# Patient Record
Sex: Female | Born: 1945 | Race: White | Hispanic: No | Smoking: Never smoker
Health system: Southern US, Community
[De-identification: ages and names within clinical notes are randomized; demographics above are authoritative.]

## PROBLEM LIST (undated history)

## (undated) DIAGNOSIS — R519 Headache, unspecified: Secondary | ICD-10-CM

## (undated) DIAGNOSIS — F419 Anxiety disorder, unspecified: Secondary | ICD-10-CM

## (undated) DIAGNOSIS — M25561 Pain in right knee: Secondary | ICD-10-CM

## (undated) DIAGNOSIS — R200 Anesthesia of skin: Secondary | ICD-10-CM

## (undated) DIAGNOSIS — M4310 Spondylolisthesis, site unspecified: Secondary | ICD-10-CM

## (undated) DIAGNOSIS — F32A Depression, unspecified: Secondary | ICD-10-CM

## (undated) DIAGNOSIS — I639 Cerebral infarction, unspecified: Secondary | ICD-10-CM

## (undated) DIAGNOSIS — D649 Anemia, unspecified: Secondary | ICD-10-CM

## (undated) DIAGNOSIS — M5136 Other intervertebral disc degeneration, lumbar region: Secondary | ICD-10-CM

## (undated) DIAGNOSIS — M2550 Pain in unspecified joint: Secondary | ICD-10-CM

## (undated) DIAGNOSIS — I7090 Unspecified atherosclerosis: Secondary | ICD-10-CM

## (undated) DIAGNOSIS — R202 Paresthesia of skin: Secondary | ICD-10-CM

## (undated) DIAGNOSIS — I10 Essential (primary) hypertension: Secondary | ICD-10-CM

## (undated) DIAGNOSIS — M545 Low back pain, unspecified: Secondary | ICD-10-CM

## (undated) DIAGNOSIS — M51369 Other intervertebral disc degeneration, lumbar region without mention of lumbar back pain or lower extremity pain: Secondary | ICD-10-CM

## (undated) DIAGNOSIS — M7989 Other specified soft tissue disorders: Secondary | ICD-10-CM

## (undated) DIAGNOSIS — F319 Bipolar disorder, unspecified: Secondary | ICD-10-CM

## (undated) DIAGNOSIS — E119 Type 2 diabetes mellitus without complications: Secondary | ICD-10-CM

## (undated) DIAGNOSIS — A64 Unspecified sexually transmitted disease: Secondary | ICD-10-CM

## (undated) DIAGNOSIS — M1990 Unspecified osteoarthritis, unspecified site: Secondary | ICD-10-CM

## (undated) DIAGNOSIS — E785 Hyperlipidemia, unspecified: Secondary | ICD-10-CM

## (undated) DIAGNOSIS — G8929 Other chronic pain: Secondary | ICD-10-CM

## (undated) HISTORY — DX: Bipolar disorder, unspecified: F31.9

## (undated) HISTORY — DX: Anemia, unspecified: D64.9

## (undated) HISTORY — DX: Headache, unspecified: R51.9

## (undated) HISTORY — DX: Unspecified atherosclerosis: I70.90

## (undated) HISTORY — DX: Other specified soft tissue disorders: M79.89

## (undated) HISTORY — DX: Depression, unspecified: F32.A

## (undated) HISTORY — DX: Low back pain, unspecified: M54.50

## (undated) HISTORY — DX: Hyperlipidemia, unspecified: E78.5

## (undated) HISTORY — DX: Pain in unspecified joint: M25.50

## (undated) HISTORY — DX: Spondylolisthesis, site unspecified: M43.10

## (undated) HISTORY — DX: Other chronic pain: G89.29

## (undated) HISTORY — DX: Anesthesia of skin: R20.0

## (undated) HISTORY — DX: Unspecified osteoarthritis, unspecified site: M19.90

## (undated) HISTORY — DX: Unspecified sexually transmitted disease: A64

## (undated) HISTORY — DX: Other intervertebral disc degeneration, lumbar region without mention of lumbar back pain or lower extremity pain: M51.369

## (undated) HISTORY — DX: Paresthesia of skin: R20.2

## (undated) HISTORY — DX: Pain in right knee: M25.561

## (undated) HISTORY — DX: Type 2 diabetes mellitus without complications: E11.9

## (undated) HISTORY — DX: Other intervertebral disc degeneration, lumbar region: M51.36

## (undated) HISTORY — DX: Cerebral infarction, unspecified: I63.9

## (undated) HISTORY — DX: Essential (primary) hypertension: I10

---

## 1976-05-15 HISTORY — PX: TUBAL LIGATION: SHX77

## 2007-05-16 HISTORY — PX: CHOLECYSTECTOMY: SHX55

## 2009-05-15 HISTORY — PX: CARPAL TUNNEL RELEASE: SHX101

## 2011-05-16 HISTORY — PX: HAND SURGERY: SHX662

## 2013-05-15 HISTORY — PX: CRANIOTOMY: SHX93

## 2014-05-15 HISTORY — PX: COLONOSCOPY: SHX174

## 2015-05-16 HISTORY — PX: ABDOMINAL HYSTERECTOMY: SHX81

## 2016-05-15 HISTORY — PX: CATARACT EXTRACTION: SUR2

## 2017-07-24 DIAGNOSIS — M179 Osteoarthritis of knee, unspecified: Secondary | ICD-10-CM

## 2017-07-24 DIAGNOSIS — M1710 Unilateral primary osteoarthritis, unspecified knee: Secondary | ICD-10-CM

## 2017-07-24 HISTORY — DX: Osteoarthritis of knee, unspecified: M17.9

## 2017-07-24 HISTORY — DX: Unilateral primary osteoarthritis, unspecified knee: M17.10

## 2020-09-27 ENCOUNTER — Ambulatory Visit (INDEPENDENT_AMBULATORY_CARE_PROVIDER_SITE_OTHER): Payer: Medicare Other | Admitting: Vascular Surgery

## 2020-09-27 ENCOUNTER — Other Ambulatory Visit: Payer: Self-pay

## 2020-09-27 ENCOUNTER — Encounter: Payer: Self-pay | Admitting: Vascular Surgery

## 2020-09-27 VITALS — BP 155/66 | HR 87 | Temp 97.9°F | Resp 16 | Ht 66.0 in | Wt 185.0 lb

## 2020-09-27 DIAGNOSIS — M5136 Other intervertebral disc degeneration, lumbar region: Secondary | ICD-10-CM | POA: Diagnosis not present

## 2020-09-27 NOTE — Progress Notes (Signed)
[facility] [place]  Patient name: Sherry Moore MRN: [id number] DOB: 1946/03/03 Sex: female  REASON FOR CONSULT: Discuss exposure for OLIF with Dr. Shon Baton with Dr. Shon Baton  HPI: Sherry Moore is a 75 y.o. female, who is here today for discussion of exposure for spinal fusion.  She reports that she has had difficulty with back pain for nearly 10 years.  This is been increasingly severe over the past several years.  She has had 1 session of spine injections with no relief.  She has seen Dr. Shon Baton who is recommended fusion at the 4 5 level oblique lateral approach.  She has multiple medical issues as listed below.    Past Medical History:  Diagnosis Date  . Acquired spondylolisthesis   . Anemia   . Atherosclerosis   . Bipolar depression (HCC)   . Chronic back pain   . Degeneration of lumbar intervertebral disc   . Depression   . Diabetes mellitus without complication (HCC)    Type 1  . Hand swelling   . Headache   . Hyperlipidemia   . Hypertension   . Joint pain   . Lumbar pain   . Numbness and tingling   . Osteoarthritis   . Osteoarthritis of knee 07/24/2017  . Right knee pain   . Spondylolisthesis, grade 1   . STD (female)    History of   . Stroke (HCC)   . Swelling of both lower extremities     Family History  Problem Relation Age of Onset  . Arthritis Mother   . Stroke Mother   . Diabetes Mother   . Hypertension Mother   . Arthritis Father   . Diabetes Father   . Heart disease Sister   . Arthritis Sister   . Diabetes Sister   . Hypertension Sister   . Hypertension Brother   . Heart disease Brother   . Diabetes Brother     SOCIAL HISTORY: Social History   Socioeconomic History  . Marital status: Divorced    Spouse name: Not on file  . Number of children: Not on file  . Years of education: Not on file  . Highest education level: Not on file  Occupational History  . Occupation: RETIRED NURSE   Tobacco Use  . Smoking status: Never Smoker  . Smokeless tobacco: Never Used  Substance and Sexual Activity  . Alcohol use: Not Currently  . Drug use: Yes    Types: Marijuana  . Sexual activity: Not on file  Other Topics Concern  . Not on file  Social History Narrative  . Not on file   Social Determinants of Health   Financial Resource Strain: Not on file  Food Insecurity: Not on file  Transportation Needs: Not on file  Physical Activity: Not on file  Stress: Not on file  Social Connections: Not on file  Intimate Partner Violence: Not on file    No Known Allergies  Current Outpatient Medications  Medication Sig Dispense Refill  . acyclovir (ZOVIRAX) 400 MG tablet Take 400 mg by mouth 5 (five) times daily.    Marland Kitchen aspirin 325 MG EC tablet Take by mouth.    Marland Kitchen atorvastatin (LIPITOR) 20 MG tablet Take by mouth.    . calcium-vitamin D (OSCAL WITH D) 500-200 MG-UNIT tablet Take 1 tablet by mouth.    . cholecalciferol (VITAMIN D3) 25 MCG (1000 UNIT) tablet Take 1,000 Units by mouth daily.    . clonazePAM (KLONOPIN) 1 MG tablet Take  by mouth.    . ENALAPRIL MALEATE PO Take 20 mg by mouth in the morning and at bedtime.    . gabapentin (NEURONTIN) 300 MG capsule Take 300 mg by mouth at bedtime.    . hydrochlorothiazide (HYDRODIURIL) 25 MG tablet Take by mouth.    . insulin glargine (LANTUS) 100 UNIT/ML Solostar Pen Inject 16 Units into the skin daily.    Marland Kitchen lamoTRIgine (LAMICTAL) 200 MG tablet Take 200 mg by mouth daily.    . Magnesium Citrate 200 MG TABS Take by mouth.    . metFORMIN (GLUCOPHAGE) 1000 MG tablet Take 1,000 mg by mouth 2 (two) times daily with a meal.    . topiramate (TOPAMAX) 25 MG tablet Take by mouth.     No current facility-administered medications for this visit.    REVIEW OF SYSTEMS:  [X]  denotes positive finding, [ ]  denotes negative finding Cardiac  Comments:  Chest pain or chest pressure:    Shortness of breath upon exertion:    Short of breath when  lying flat:    Irregular heart rhythm:        Vascular    Pain in calf, thigh, or hip brought on by ambulation:    Pain in feet at night that wakes you up from your sleep:     Blood clot in your veins:    Leg swelling:         Pulmonary    Oxygen at home:    Productive cough:     Wheezing:         Neurologic    Sudden weakness in arms or legs:     Sudden numbness in arms or legs:     Sudden onset of difficulty speaking or slurred speech:    Temporary loss of vision in one eye:     Problems with dizziness:         Gastrointestinal    Blood in stool:     Vomited blood:         Genitourinary    Burning when urinating:     Blood in urine:        Psychiatric    Major depression:         Hematologic    Bleeding problems:    Problems with blood clotting too easily:        Skin    Rashes or ulcers:        Constitutional    Fever or chills:      PHYSICAL EXAM: Vitals:   09/27/20 0951  BP: (!) 155/66  Pulse: 87  Resp: 16  Temp: 97.9 F (36.6 C)  TempSrc: Other (Comment)  SpO2: 98%  Weight: 185 lb (83.9 kg)  Height: 5\' 6"  (1.676 m)    GENERAL: The patient is a well-nourished female, in no acute distress. The vital signs are documented above. CARDIOVASCULAR: 2+ radial and 2+ dorsalis pedis pulses bilaterally. PULMONARY: There is good air exchange  MUSCULOSKELETAL: There are no major deformities or cyanosis. NEUROLOGIC: No focal weakness or paresthesias are detected. SKIN: There are no ulcers or rashes noted. PSYCHIATRIC: The patient has a normal affect.  DATA:  MRI and plain films of the spine were reviewed.  She has a normal location of her aortic and vena caval bifurcation.  She does not have a CT scan of her spine.  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  Explained that Dr. Shon Baton has determined her best option for control of  her pain is surgery and one component is from the oblique approach.  I explained our role in providing exposure.  I do feel she is an acceptable risk for surgery.  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  She is not obese.  BMI is 2.9  She has had prior laparoscopic cholecystectomy and vaginal hysterectomy.  I explained that neither of these should have a bearing on her retroperitoneal exposure  She is planning on having her surgery in July.  A date has not been set.  Her sister will come to help with her recovery.  I explained that I would no longer be doing surgery at [facility] beginning in July.  I explained that Dr. Chestine Spore would be doing her surgery.  I will provide him with her disc so he can review her films as well.   Larina Earthly, MD FACS [facility] [phone] Pager [phone]  Note: Portions of this report may have been transcribed using voice recognition software.  Every effort has been made to ensure accuracy; however, inadvertent computerized transcription errors may still be present.

## 2020-11-23 ENCOUNTER — Ambulatory Visit: Payer: Self-pay | Admitting: Orthopedic Surgery

## 2020-12-03 ENCOUNTER — Other Ambulatory Visit: Payer: Self-pay

## 2020-12-06 ENCOUNTER — Ambulatory Visit: Payer: Self-pay | Admitting: Orthopedic Surgery

## 2020-12-06 NOTE — H&P (Deleted)
  The note originally documented on this encounter has been moved the the encounter in which it belongs.  

## 2020-12-06 NOTE — H&P (Signed)
Subjective:   Sherry Moore is a very pleasant active 75 year old woman who is been having severe back buttock and neuropathic leg pain. Clinical exam is consistent with lumbar spinal stenosis with neurogenic claudication. Despite injection therapy, and physical therapy her quality-of-life his continue to deteriorate. Imaging studies demonstrate a dynamic degenerative spondylolisthesis at L4-5 with severe stenosis. We have discussed risks and benefits of surgical intervention and she has elected to move forward with surgery.  The patient is a 75 year old female who presents for pre-operative visit in preparation for their OLIF L4-5 with PSFI L4-5 , which is scheduled on 12/16/20 with Dr. Shon Baton at [facility].  Past Medical History:  Diagnosis Date   Acquired spondylolisthesis    Anemia    Atherosclerosis    Bipolar depression (HCC)    Chronic back pain    Degeneration of lumbar intervertebral disc    Depression    Diabetes mellitus without complication (HCC)    Type 1   Hand swelling    Headache    Hyperlipidemia    Hypertension    Joint pain    Lumbar pain    Numbness and tingling    Osteoarthritis    Osteoarthritis of knee 07/24/2017   Right knee pain    Spondylolisthesis, grade 1    STD (female)    History of    Stroke [facility])    Swelling of both lower extremities     Past Surgical History:  Procedure Laterality Date   ABDOMINAL HYSTERECTOMY  05/16/2015   CARPAL TUNNEL RELEASE  05/15/2009   CATARACT EXTRACTION  05/15/2016   CHOLECYSTECTOMY  05/16/2007   COLONOSCOPY  05/15/2014   HAND SURGERY  05/16/2011   TUBAL LIGATION  05/15/1976    Current Outpatient Medications  Medication Sig Dispense Refill Last Dose   acyclovir (ZOVIRAX) 400 MG tablet Take 400 mg by mouth 5 (five) times daily.      aspirin 325 MG EC tablet Take by mouth.      atorvastatin (LIPITOR) 20 MG tablet Take by mouth.      calcium-vitamin D (OSCAL WITH D) 500-200 MG-UNIT tablet Take 1 tablet by mouth.       cholecalciferol (VITAMIN D3) 25 MCG (1000 UNIT) tablet Take 1,000 Units by mouth daily.      clonazePAM (KLONOPIN) 1 MG tablet Take by mouth.      ENALAPRIL MALEATE PO Take 20 mg by mouth in the morning and at bedtime.      gabapentin (NEURONTIN) 300 MG capsule Take 300 mg by mouth at bedtime.      hydrochlorothiazide (HYDRODIURIL) 25 MG tablet Take by mouth.      insulin glargine (LANTUS) 100 UNIT/ML Solostar Pen Inject 16 Units into the skin daily.      lamoTRIgine (LAMICTAL) 200 MG tablet Take 200 mg by mouth daily.      Magnesium Citrate 200 MG TABS Take by mouth.      metFORMIN (GLUCOPHAGE) 1000 MG tablet Take 1,000 mg by mouth 2 (two) times daily with a meal.      topiramate (TOPAMAX) 25 MG tablet Take by mouth.      No current facility-administered medications for this visit.   No Known Allergies  Social History   Tobacco Use   Smoking status: Never   Smokeless tobacco: Never  Substance Use Topics   Alcohol use: Not Currently    Family History  Problem Relation Age of Onset   Arthritis Mother    Stroke Mother  Diabetes Mother    Hypertension Mother    Arthritis Father    Diabetes Father    Heart disease Sister    Arthritis Sister    Diabetes Sister    Hypertension Sister    Hypertension Brother    Heart disease Brother    Diabetes Brother     Review of Systems Pertinent items are noted in HPI.  Objective:   Vitals: Ht: 5 ft 6 in 12/06/2020 10:23 am BP: 160/90 12/06/2020 10:26 am Pulse: 88 bpm 12/06/2020 10:25 am  Clinical exam: Sherry Moore is a pleasant individual, who appears younger than their stated age. She is alert and orientated 3. No shortness of breath, chest pain.  Heart: Regular rate and rhythm, no rubs, murmurs, or gallops  Lungs: Clear to auscultation bilaterally  Abdomen is soft and non-tender, negative loss of bowel and bladder control, no rebound tenderness. Negative: skin lesions abrasions contusions. Bowel sounds 4 Peripheral pulses:  2+ dorsalis pedis/posterior tibialis pulses. Compartment soft and nontender. Gait pattern: Normal Assistive devices: None Neuro: Intermittent dysesthesias primarily into the lower extremity. 5/5 motor strength in the lower extremity. Negative Babinski test, no clonus, 1+ deep tendon reflexes at the knee and absent at the Achilles. Musculoskeletal: Significant back pain with palpation and range of motion. No SI joint pain. No hip, knee, ankle pain with isolated joint range of motion X-rays of the lumbar spine including flexion extension views were reviewed. She has a grade 2 dynamic degenerative spondylolisthesis at L4-5 with degenerative lumbar disc disease. No scoliosis is noted. Lumbar MRI: completed on 07/05/18 was reviewed with the patient. It was completed at outside institution; I have independently reviewed the images as well as the radiology report. Grade 2 degenerative spondylolisthesis with severe spinal stenosis L4-5. Significant central and foraminal stenosis at this level. Mild degenerative changes L1 work with no significant foraminal stenosis.  Assessment:   Sherry Moore is a very pleasant active 75 year old woman who is been having severe back buttock and neuropathic leg pain. Clinical exam is consistent with lumbar spinal stenosis with neurogenic claudication. Despite injection therapy, and physical therapy her quality-of-life his continue to deteriorate. Imaging studies demonstrate a dynamic degenerative spondylolisthesis at L4-5 with severe stenosis.   Plan:    After reviewing her imaging studies we discussed surgical intervention. In order to address the instability and the stenosis I am recommending an oblique lumbar interbody fusion with posterior supplemental fixation. The interbody fusion will allow for indirect decompression of the thecal sac as well as stabilization of the dynamic instability at this level. The supplemental pedicle screw fixation will improve her overall fusion  rate. I did tell her that if she continued to have signs and symptoms of neurogenic claudication that in the future she may need a formal L4-5 decompression posteriorly. But I do believe the indirect decompression should be satisfactory enough to relieve her of her neurogenic claudication pain. OLIF/XLIF risks, benefits of surgery were reviewed with the patient. These include: infection, bleeding, death, stroke, paralysis, ongoing or worse pain, need for additional surgery, injury to the lumbar plexus resulting in hip flexor weakness and difficulty walking without assistive devices. Adjacent segment degenerative disease, need for additional surgery including fusing other levels, leak of spinal fluid, Nonunion, hardware failure, breakage, or mal-position. Deep venous thrombosis (DVT) requiring additional treatment such as filter, and/or medications. Injury to abdominal contents, loss in bowel and bladder control. Risks and benefits of spinal fusion: Infection, bleeding, death, stroke, paralysis, ongoing or worse pain, need for additional surgery, nonunion,  leak of spinal fluid, adjacent segment degeneration requiring additional fusion surgery, Injury to abdominal vessels that can require anterior surgery to stop bleeding. Malposition of the cage and/or pedicle screws that could require additional surgery. Loss of bowel and bladder control. Postoperative hematoma causing neurologic compression that could require urgent or emergent re-operation.  We will obtain preoperative medical clearance from the patient's primary care provider. She is also been evaluated by a vascular surgeon for the approach.  I have reviewed the patient's medication list with her. She is not on any blood thinners. She does use aspirin she will stop 5 days prior to surgery. She also takes Aleve which she will stop prior to surgery as well.  She is scheduled with physical therapy to be fitted for her LSO brace.  She has her preop  appointment scheduled with [facility] as well.  We have also discussed the post-operative recovery period to include: bathing/showering restrictions, wound healing, activity (and driving) restrictions, medications/pain mangement.  We have also discussed post-operative redflags to include: signs and symptoms of postoperative infection, DVT/PE.  A copy of the discharge instructions was reviewed with the patient and she was given a copy to take home.  All patients questions were invited and answered  Follow-up: 2 weeks postop

## 2020-12-10 NOTE — Progress Notes (Signed)
Surgical Instructions    Your procedure is scheduled on Thursday August 4th.  Report to [facility] Main Entrance "A" at 5:30 A.M., then check in with the Admitting office.  Call this number if you have problems the morning of surgery:  [phone]   If you have any questions prior to your surgery date call [phone]: Open Monday-Friday 8am-4pm    Remember:  Do not eat after midnight the night before your surgery  You may drink clear liquids until 4:30am the morning of your surgery.   Clear liquids allowed are: Water, Non-Citrus Juices (without pulp), Carbonated Beverages, Clear Tea, Black Coffee Only, and Gatorade    Take these medicines the morning of surgery with A SIP OF WATER  acyclovir (ZOVIRAX) 400 MG tablet omeprazole (PRILOSEC) 40 MG capsule  IF NEEDED acetaminophen (TYLENOL) 500 MG tablet hydrALAZINE (APRESOLINE) 25 MG tablet  WHAT DO I DO ABOUT MY DIABETES MEDICATION?   Do not take oral diabetes medicines (pills-Metformin) the morning of surgery.  THE NIGHT BEFORE SURGERY, take ____10_______ units of ______Lantus_____insulin.     THE MORNING OF SURGERY, do not take any insulin.   HOW TO MANAGE YOUR DIABETES BEFORE AND AFTER SURGERY  Why is it important to control my blood sugar before and after surgery? Improving blood sugar levels before and after surgery helps healing and can limit problems. A way of improving blood sugar control is eating a healthy diet by:  Eating less sugar and carbohydrates  Increasing activity/exercise  Talking with your doctor about reaching your blood sugar goals High blood sugars (greater than 180 mg/dL) can raise your risk of infections and slow your recovery, so you will need to focus on controlling your diabetes during the weeks before surgery. Make sure that the doctor who takes care of your diabetes knows about your planned surgery including the date and location.  How do I manage my blood sugar before surgery? Check your  blood sugar at least 4 times a day, starting 2 days before surgery, to make sure that the level is not too high or low.  Check your blood sugar the morning of your surgery when you wake up and every 2 hours until you get to the Short Stay unit.  If your blood sugar is less than 70 mg/dL, you will need to treat for low blood sugar: Do not take insulin. Treat a low blood sugar (less than 70 mg/dL) with  cup of clear juice (cranberry or apple), 4 glucose tablets, OR glucose gel. Recheck blood sugar in 15 minutes after treatment (to make sure it is greater than 70 mg/dL). If your blood sugar is not greater than 70 mg/dL on recheck, call [phone] for further instructions. Report your blood sugar to the short stay nurse when you get to Short Stay.  If you are admitted to the hospital after surgery: Your blood sugar will be checked by the staff and you will probably be given insulin after surgery (instead of oral diabetes medicines) to make sure you have good blood sugar levels. The goal for blood sugar control after surgery is 80-180 mg/dL.    As of today, STOP taking any Aspirin (unless otherwise instructed by your surgeon) Aleve, Naproxen, Ibuprofen, Motrin, Advil, Goody's, BC's, all herbal medications, fish oil, and all vitamins.          Do not wear jewelry or makeup Do not wear lotions, powders, perfumes, or deodorant. Do not shave 48 hours prior to surgery.   Do not bring valuables  to the hospital. DO Not wear nail polish, gel polish, artificial nails, or any other type of covering on  natural nails including finger and toenails. If patients have artificial nails, gel coating, etc. that need to be removed by a nail salon please have this removed prior to surgery or surgery may need to be canceled/delayed if the surgeon/ anesthesia feels like the patient is unable to be adequately monitored.             [facility] is not responsible for any belongings or valuables.  Do NOT Smoke  (Tobacco/Vaping) or drink Alcohol 24 hours prior to your procedure If you use a CPAP at night, you may bring all equipment for your overnight stay.   Contacts, glasses, dentures or bridgework may not be worn into surgery, please bring cases for these belongings   For patients admitted to the hospital, discharge time will be determined by your treatment team.   Patients discharged the day of surgery will not be allowed to drive home, and someone needs to stay with them for 24 hours.  ONLY 1 SUPPORT PERSON MAY BE PRESENT WHILE YOU ARE IN SURGERY. IF YOU ARE TO BE ADMITTED ONCE YOU ARE IN YOUR ROOM YOU WILL BE ALLOWED TWO (2) VISITORS.  Minor children may have two parents present. Special consideration for safety and communication needs will be reviewed on a case by case basis.  Special instructions:    Oral Hygiene is also important to reduce your risk of infection.  Remember - BRUSH YOUR TEETH THE MORNING OF SURGERY WITH YOUR REGULAR TOOTHPASTE   [facility]- Preparing For Surgery  Before surgery, you can play an important role. Because skin is not sterile, your skin needs to be as free of germs as possible. You can reduce the number of germs on your skin by washing with CHG (chlorahexidine gluconate) Soap before surgery.  CHG is an antiseptic cleaner which kills germs and bonds with the skin to continue killing germs even after washing.     Please do not use if you have an allergy to CHG or antibacterial soaps. If your skin becomes reddened/irritated stop using the CHG.  Do not shave (including legs and underarms) for at least 48 hours prior to first CHG shower. It is OK to shave your face.  Please follow these instructions carefully.     Shower the NIGHT BEFORE SURGERY and the MORNING OF SURGERY with CHG Soap.   If you chose to wash your hair, wash your hair first as usual with your normal shampoo. After you shampoo, rinse your hair and body thoroughly to remove the shampoo.  Then [employer] and genitals (private parts) with your normal soap and rinse thoroughly to remove soap.  After that Use CHG Soap as you would any other liquid soap. You can apply CHG directly to the skin and wash gently with a scrungie or a clean washcloth.   Apply the CHG Soap to your body ONLY FROM THE NECK DOWN.  Do not use on open wounds or open sores. Avoid contact with your eyes, ears, mouth and genitals (private parts). Wash Face and genitals (private parts)  with your normal soap.   Wash thoroughly, paying special attention to the area where your surgery will be performed.  Thoroughly rinse your body with warm water from the neck down.  DO NOT shower/wash with your normal soap after using and rinsing off the CHG Soap.  Pat yourself dry with a CLEAN TOWEL.  Wear CLEAN PAJAMAS to bed the night before surgery  Place CLEAN SHEETS on your bed the night before your surgery  DO NOT SLEEP WITH PETS.   Day of Surgery:  Take a shower with CHG soap. Wear Clean/Comfortable clothing the morning of surgery Do not apply any deodorants/lotions.   Remember to brush your teeth WITH YOUR REGULAR TOOTHPASTE.   Please read over the following fact sheets that you were given.

## 2020-12-13 ENCOUNTER — Other Ambulatory Visit: Payer: Self-pay

## 2020-12-13 ENCOUNTER — Encounter (HOSPITAL_COMMUNITY)
Admission: RE | Admit: 2020-12-13 | Discharge: 2020-12-13 | Disposition: A | Discharge status: Home / Self Care | Payer: Medicare Other | Source: Ambulatory Visit | Attending: Orthopedic Surgery | Admitting: Orthopedic Surgery

## 2020-12-13 ENCOUNTER — Other Ambulatory Visit: Payer: Self-pay | Admitting: Orthopedic Surgery

## 2020-12-13 ENCOUNTER — Ambulatory Visit (HOSPITAL_COMMUNITY)
Admission: RE | Admit: 2020-12-13 | Discharge: 2020-12-13 | Disposition: A | Discharge status: Home / Self Care | Payer: Medicare Other | Source: Ambulatory Visit | Attending: Orthopedic Surgery | Admitting: Orthopedic Surgery

## 2020-12-13 ENCOUNTER — Encounter (HOSPITAL_COMMUNITY): Payer: Self-pay

## 2020-12-13 DIAGNOSIS — Z01818 Encounter for other preprocedural examination: Secondary | ICD-10-CM | POA: Insufficient documentation

## 2020-12-13 HISTORY — DX: Anxiety disorder, unspecified: F41.9

## 2020-12-13 LAB — CBC
HCT: 45.9 % (ref 36.0–46.0)
Hemoglobin: 14.8 g/dL (ref 12.0–15.0)
MCH: 28.8 pg (ref 26.0–34.0)
MCHC: 32.2 g/dL (ref 30.0–36.0)
MCV: 89.5 fL (ref 80.0–100.0)
Platelets: 387 10*3/uL (ref 150–400)
RBC: 5.13 MIL/uL — ABNORMAL HIGH (ref 3.87–5.11)
RDW: 13.3 % (ref 11.5–15.5)
WBC: 7.9 10*3/uL (ref 4.0–10.5)
nRBC: 0 % (ref 0.0–0.2)

## 2020-12-13 LAB — TYPE AND SCREEN
ABO/RH(D): AB POS
Antibody Screen: NEGATIVE

## 2020-12-13 LAB — BASIC METABOLIC PANEL
Anion gap: 11 (ref 5–15)
BUN: 11 mg/dL (ref 8–23)
CO2: 27 mmol/L (ref 22–32)
Calcium: 9.8 mg/dL (ref 8.9–10.3)
Chloride: 99 mmol/L (ref 98–111)
Creatinine, Ser: 0.83 mg/dL (ref 0.44–1.00)
GFR, Estimated: >60 mL/min (ref >60)
Glucose, Bld: 111 mg/dL — ABNORMAL HIGH (ref 70–99)
Potassium: 4.1 mmol/L (ref 3.5–5.1)
Sodium: 137 mmol/L (ref 135–145)

## 2020-12-13 LAB — HEMOGLOBIN A1C
Hgb A1c MFr Bld: 6.8 % — ABNORMAL HIGH (ref 4.8–5.6)
Mean Plasma Glucose: 148.46 mg/dL

## 2020-12-13 LAB — URINALYSIS, ROUTINE W REFLEX MICROSCOPIC
Bilirubin Urine: NEGATIVE
Glucose, UA: NEGATIVE mg/dL
Hgb urine dipstick: NEGATIVE
Ketones, ur: NEGATIVE mg/dL
Leukocytes,Ua: NEGATIVE
Nitrite: NEGATIVE
Protein, ur: NEGATIVE mg/dL
Specific Gravity, Urine: 1.01 (ref 1.005–1.030)
pH: 5.5 (ref 5.0–8.0)

## 2020-12-13 LAB — APTT: aPTT: 28 seconds (ref 24–36)

## 2020-12-13 LAB — PROTIME-INR
INR: 1 (ref 0.8–1.2)
Prothrombin Time: 12.8 seconds (ref 11.4–15.2)

## 2020-12-13 NOTE — Progress Notes (Signed)
PCP - Dr. Jaynie Crumble Cardiologist - denies  PPM/ICD - n/a Device Orders - n/a Rep Notified - n/a  Chest x-ray - 12/13/20 EKG - 12/13/20 Stress Test - denies ECHO - Pt. States she thinks she had an "echocardiogram many many  years ago at [facility]" but cannot remember the name of the doctor she saw. She said she was seen for tachycardia and everything was normal.  Cardiac Cath - denies  Sleep Study - denies CPAP - n/a  Fasting Blood Sugar - 115 Checks Blood Sugar one to two times a day Range: 90-125 per patient  Blood Thinner Instructions: Aspirin Instructions: As of today stop taking any Aspirin unless otherwise instructed by your surgeon. Patient states she stopped Asprin 12/09/20.  ERAS Protcol - Yes PRE-SURGERY Ensure or G2- n/a  COVID TEST- Patient sent to drive through testing site. Patient given map with phone number and is going when she leaves her PAT appointment.    Anesthesia review: Yes. Per Dr. Shon Baton order  Patient denies shortness of breath, fever, cough and chest pain at PAT appointment   All instructions explained to the patient, with a verbal understanding of the material. Patient agrees to go over the instructions while at home for a better understanding. Patient also instructed to self quarantine after being tested for COVID-19. The opportunity to ask questions was provided.

## 2020-12-13 NOTE — Progress Notes (Signed)
Surgical Instructions    Your procedure is scheduled on Thursday August 4th.  Report to [facility] Main Entrance "A" at 5:30 A.M., then check in with the Admitting office.  Call this number if you have problems the morning of surgery:  [phone]   If you have any questions prior to your surgery date call [phone]: Open Monday-Friday 8am-4pm    Remember:  Do not eat after midnight the night before your surgery  You may drink clear liquids until 4:30am the morning of your surgery.   Clear liquids allowed are: Water, Non-Citrus Juices (without pulp), Carbonated Beverages, Clear Tea, Black Coffee Only, and Gatorade    Take these medicines the morning of surgery with A SIP OF WATER  acyclovir (ZOVIRAX) 400 MG tablet omeprazole (PRILOSEC) 40 MG capsule  IF NEEDED acetaminophen (TYLENOL) 500 MG tablet hydrALAZINE (APRESOLINE) 25 MG tablet  WHAT DO I DO ABOUT MY DIABETES MEDICATION?   Do not take oral diabetes medicines (pills-Metformin) the morning of surgery.  THE NIGHT BEFORE SURGERY, do not take insulin glargine (LANTUS).   THE MORNING OF SURGERY, do not take any insulin.   HOW TO MANAGE YOUR DIABETES BEFORE AND AFTER SURGERY  Why is it important to control my blood sugar before and after surgery? Improving blood sugar levels before and after surgery helps healing and can limit problems. A way of improving blood sugar control is eating a healthy diet by:  Eating less sugar and carbohydrates  Increasing activity/exercise  Talking with your doctor about reaching your blood sugar goals High blood sugars (greater than 180 mg/dL) can raise your risk of infections and slow your recovery, so you will need to focus on controlling your diabetes during the weeks before surgery. Make sure that the doctor who takes care of your diabetes knows about your planned surgery including the date and location.  How do I manage my blood sugar before surgery? Check your blood sugar at  least 4 times a day, starting 2 days before surgery, to make sure that the level is not too high or low.  Check your blood sugar the morning of your surgery when you wake up and every 2 hours until you get to the Short Stay unit.  If your blood sugar is less than 70 mg/dL, you will need to treat for low blood sugar: Do not take insulin. Treat a low blood sugar (less than 70 mg/dL) with  cup of clear juice (cranberry or apple), 4 glucose tablets, OR glucose gel. Recheck blood sugar in 15 minutes after treatment (to make sure it is greater than 70 mg/dL). If your blood sugar is not greater than 70 mg/dL on recheck, call [phone] for further instructions. Report your blood sugar to the short stay nurse when you get to Short Stay.  If you are admitted to the hospital after surgery: Your blood sugar will be checked by the staff and you will probably be given insulin after surgery (instead of oral diabetes medicines) to make sure you have good blood sugar levels. The goal for blood sugar control after surgery is 80-180 mg/dL.    As of today, STOP taking any Aspirin (unless otherwise instructed by your surgeon) Aleve, Naproxen, Ibuprofen, Motrin, Advil, Goody's, BC's, all herbal medications, fish oil, and all vitamins.          Do not wear jewelry or makeup Do not wear lotions, powders, perfumes, or deodorant. Do not shave 48 hours prior to surgery.   Do not bring valuables to  the hospital. DO Not wear nail polish, gel polish, artificial nails, or any other type of covering on  natural nails including finger and toenails. If patients have artificial nails, gel coating, etc. that need to be removed by a nail salon please have this removed prior to surgery or surgery may need to be canceled/delayed if the surgeon/ anesthesia feels like the patient is unable to be adequately monitored.             [facility] is not responsible for any belongings or valuables.  Do NOT Smoke (Tobacco/Vaping) or  drink Alcohol 24 hours prior to your procedure If you use a CPAP at night, you may bring all equipment for your overnight stay.   Contacts, glasses, dentures or bridgework may not be worn into surgery, please bring cases for these belongings   For patients admitted to the hospital, discharge time will be determined by your treatment team.   Patients discharged the day of surgery will not be allowed to drive home, and someone needs to stay with them for 24 hours.  ONLY 1 SUPPORT PERSON MAY BE PRESENT WHILE YOU ARE IN SURGERY. IF YOU ARE TO BE ADMITTED ONCE YOU ARE IN YOUR ROOM YOU WILL BE ALLOWED TWO (2) VISITORS.  Minor children may have two parents present. Special consideration for safety and communication needs will be reviewed on a case by case basis.  Special instructions:    Oral Hygiene is also important to reduce your risk of infection.  Remember - BRUSH YOUR TEETH THE MORNING OF SURGERY WITH YOUR REGULAR TOOTHPASTE   [place]- Preparing For Surgery  Before surgery, you can play an important role. Because skin is not sterile, your skin needs to be as free of germs as possible. You can reduce the number of germs on your skin by washing with CHG (chlorahexidine gluconate) Soap before surgery.  CHG is an antiseptic cleaner which kills germs and bonds with the skin to continue killing germs even after washing.     Please do not use if you have an allergy to CHG or antibacterial soaps. If your skin becomes reddened/irritated stop using the CHG.  Do not shave (including legs and underarms) for at least 48 hours prior to first CHG shower. It is OK to shave your face.  Please follow these instructions carefully.     Shower the NIGHT BEFORE SURGERY and the MORNING OF SURGERY with CHG Soap.   If you chose to wash your hair, wash your hair first as usual with your normal shampoo. After you shampoo, rinse your hair and body thoroughly to remove the shampoo.  Then [employer] and genitals  (private parts) with your normal soap and rinse thoroughly to remove soap.  After that Use CHG Soap as you would any other liquid soap. You can apply CHG directly to the skin and wash gently with a scrungie or a clean washcloth.   Apply the CHG Soap to your body ONLY FROM THE NECK DOWN.  Do not use on open wounds or open sores. Avoid contact with your eyes, ears, mouth and genitals (private parts). Wash Face and genitals (private parts)  with your normal soap.   Wash thoroughly, paying special attention to the area where your surgery will be performed.  Thoroughly rinse your body with warm water from the neck down.  DO NOT shower/wash with your normal soap after using and rinsing off the CHG Soap.  Pat yourself dry with a CLEAN TOWEL.  Wear  CLEAN PAJAMAS to bed the night before surgery  Place CLEAN SHEETS on your bed the night before your surgery  DO NOT SLEEP WITH PETS.   Day of Surgery:  Take a shower with CHG soap. Wear Clean/Comfortable clothing the morning of surgery Do not apply any deodorants/lotions.   Remember to brush your teeth WITH YOUR REGULAR TOOTHPASTE.   Please read over the following fact sheets that you were given.

## 2020-12-14 ENCOUNTER — Ambulatory Visit: Payer: Self-pay | Admitting: Orthopedic Surgery

## 2020-12-14 ENCOUNTER — Encounter (HOSPITAL_COMMUNITY): Payer: Self-pay

## 2020-12-14 LAB — SURGICAL PCR SCREEN
MRSA, PCR: POSITIVE — AB
Staphylococcus aureus: POSITIVE — AB

## 2020-12-14 NOTE — Progress Notes (Signed)
Called and spoke to [place] at Dr. Shon Baton office to notify of patient's PCR positive for MRSA and MSSA. Per Cordelia Pen she will notify Dr. Shon Baton.

## 2020-12-14 NOTE — Progress Notes (Signed)
Lab called +MRSA/+MSSA result on surgical PCR. Will need nasal Betadine DOS per protocol.

## 2020-12-14 NOTE — Anesthesia Preprocedure Evaluation (Addendum)
Anesthesia Evaluation  Patient identified by MRN, date of birth, ID band Patient awake    Reviewed: Allergy & Precautions, NPO status , Patient's Chart, lab work & pertinent test results  Airway Mallampati: II  TM Distance: >3 FB Neck ROM: Full    Dental  (+) Dental Advisory Given, Teeth Intact   Pulmonary neg pulmonary ROS,    breath sounds clear to auscultation       Cardiovascular hypertension, Pt. on medications  Rhythm:Regular Rate:Normal     Neuro/Psych  Headaches, PSYCHIATRIC DISORDERS Anxiety Depression Bipolar Disorder CVA    GI/Hepatic Neg liver ROS, GERD  Medicated,  Endo/Other  diabetes, Type 2, Insulin Dependent, Oral Hypoglycemic Agents  Renal/GU negative Renal ROS     Musculoskeletal  (+) Arthritis ,   Abdominal Normal abdominal exam  (+)   Peds  Hematology negative hematology ROS (+)   Anesthesia Other Findings   Reproductive/Obstetrics                           Anesthesia Physical Anesthesia Plan  ASA: 2  Anesthesia Plan: General   Post-op Pain Management: GA combined w/ Regional for post-op pain   Induction: Intravenous  PONV Risk Score and Plan: 4 or greater and Ondansetron, Dexamethasone, Midazolam and Scopolamine patch - Pre-op  Airway Management Planned: Oral ETT  Additional Equipment: None  Intra-op Plan:   Post-operative Plan: Extubation in OR  Informed Consent: I have reviewed the patients History and Physical, chart, labs and discussed the procedure including the risks, benefits and alternatives for the proposed anesthesia with the patient or authorized representative who has indicated his/her understanding and acceptance.     Dental advisory given  Plan Discussed with: CRNA  Anesthesia Plan Comments: (PAT note by Antionette Poles, PA-C: Elevated blood pressure noted preadmission testing appointment, 182/69.  Patient is on enalapril, hydralazine, HCTZ  for management of hypertension and reports compliance with medications.  This is followed by her PCP Dr. Fanny Dance.  Last seen 10/22/2020 and management of hypertension was discussed at that time.  Per note, "BP has been more stable-she has been tolerating hydralazine-BP will go up if she gets upset or is late for dose become straight back down."  Clearance for upcoming surgery was also addressed.  Per note, "presurgery evaluation-she has no cardiopulmonary complaints.  Blood pressure is stable and responds well to hydralazine adjustments.  Diabetic control is adequate and EKG benign.  Labs are pending but I do agree with proceeding to surgery when able to schedule.  I do expect she will do well."  Elevated blood pressure was called to Dr. Gary Fleet surgical scheduler as well.  Reiterated that markedly uncontrolled blood pressure on day of surgery could be cause for cancellation.  Preop labs reviewed, unremarkable.  IDDM2 well-controlled with A1c 6.8.  EKG 12/13/2020: Normal sinus rhythm.  Rate 88. )      Anesthesia Quick Evaluation

## 2020-12-14 NOTE — Progress Notes (Signed)
Anesthesia Chart Review:  Elevated blood pressure noted preadmission testing appointment, 182/69.  Patient is on enalapril, hydralazine, HCTZ for management of hypertension and reports compliance with medications.  This is followed by her PCP Dr. Fanny Dance.  Last seen 10/22/2020 and management of hypertension was discussed at that time.  Per note, "BP has been more stable-she has been tolerating hydralazine-BP will go up if she gets upset or is late for dose become straight back down."  Clearance for upcoming surgery was also addressed.  Per note, "presurgery evaluation-she has no cardiopulmonary complaints.  Blood pressure is stable and responds well to hydralazine adjustments.  Diabetic control is adequate and EKG benign.  Labs are pending but I do agree with proceeding to surgery when able to schedule.  I do expect she will do well."  Elevated blood pressure was called to Dr. Gary Fleet surgical scheduler as well.  Reiterated that markedly uncontrolled blood pressure on day of surgery could be cause for cancellation.  Preop labs reviewed, unremarkable.  IDDM2 well-controlled with A1c 6.8.  EKG 12/13/2020: Normal sinus rhythm.  Rate 88.   Zannie Cove [facility] Short Stay Center/Anesthesiology Phone [phone] 12/14/2020 12:37 PM

## 2020-12-16 ENCOUNTER — Encounter (HOSPITAL_COMMUNITY)
Admission: RE | Disposition: A | Discharge status: Home / Self Care | Payer: Self-pay | Source: Home / Self Care | Attending: Orthopedic Surgery

## 2020-12-16 ENCOUNTER — Encounter (HOSPITAL_COMMUNITY): Payer: Self-pay | Admitting: Orthopedic Surgery

## 2020-12-16 ENCOUNTER — Inpatient Hospital Stay (HOSPITAL_COMMUNITY)
Admission: RE | Admit: 2020-12-16 | Discharge: 2020-12-18 | DRG: 460 | Disposition: A | Discharge status: Home / Self Care | Payer: Medicare Other | Attending: Orthopedic Surgery | Admitting: Orthopedic Surgery

## 2020-12-16 ENCOUNTER — Inpatient Hospital Stay (HOSPITAL_COMMUNITY): Payer: Medicare Other

## 2020-12-16 ENCOUNTER — Inpatient Hospital Stay (HOSPITAL_COMMUNITY): Payer: Medicare Other | Admitting: Certified Registered Nurse Anesthetist

## 2020-12-16 ENCOUNTER — Other Ambulatory Visit: Payer: Self-pay

## 2020-12-16 ENCOUNTER — Inpatient Hospital Stay (HOSPITAL_COMMUNITY): Payer: Medicare Other | Admitting: Physician Assistant

## 2020-12-16 DIAGNOSIS — Z9049 Acquired absence of other specified parts of digestive tract: Secondary | ICD-10-CM | POA: Diagnosis not present

## 2020-12-16 DIAGNOSIS — Z8673 Personal history of transient ischemic attack (TIA), and cerebral infarction without residual deficits: Secondary | ICD-10-CM

## 2020-12-16 DIAGNOSIS — Z419 Encounter for procedure for purposes other than remedying health state, unspecified: Secondary | ICD-10-CM

## 2020-12-16 DIAGNOSIS — Z90710 Acquired absence of both cervix and uterus: Secondary | ICD-10-CM

## 2020-12-16 DIAGNOSIS — Z8261 Family history of arthritis: Secondary | ICD-10-CM | POA: Diagnosis not present

## 2020-12-16 DIAGNOSIS — Z833 Family history of diabetes mellitus: Secondary | ICD-10-CM

## 2020-12-16 DIAGNOSIS — Z7984 Long term (current) use of oral hypoglycemic drugs: Secondary | ICD-10-CM

## 2020-12-16 DIAGNOSIS — K219 Gastro-esophageal reflux disease without esophagitis: Secondary | ICD-10-CM | POA: Diagnosis present

## 2020-12-16 DIAGNOSIS — E119 Type 2 diabetes mellitus without complications: Secondary | ICD-10-CM | POA: Diagnosis present

## 2020-12-16 DIAGNOSIS — Z79899 Other long term (current) drug therapy: Secondary | ICD-10-CM

## 2020-12-16 DIAGNOSIS — G8929 Other chronic pain: Secondary | ICD-10-CM | POA: Diagnosis present

## 2020-12-16 DIAGNOSIS — M1990 Unspecified osteoarthritis, unspecified site: Secondary | ICD-10-CM | POA: Diagnosis present

## 2020-12-16 DIAGNOSIS — E860 Dehydration: Secondary | ICD-10-CM | POA: Diagnosis not present

## 2020-12-16 DIAGNOSIS — Z8249 Family history of ischemic heart disease and other diseases of the circulatory system: Secondary | ICD-10-CM | POA: Diagnosis not present

## 2020-12-16 DIAGNOSIS — M48062 Spinal stenosis, lumbar region with neurogenic claudication: Principal | ICD-10-CM | POA: Diagnosis present

## 2020-12-16 DIAGNOSIS — M79606 Pain in leg, unspecified: Secondary | ICD-10-CM | POA: Diagnosis present

## 2020-12-16 DIAGNOSIS — I10 Essential (primary) hypertension: Secondary | ICD-10-CM | POA: Diagnosis present

## 2020-12-16 DIAGNOSIS — M5416 Radiculopathy, lumbar region: Secondary | ICD-10-CM | POA: Diagnosis present

## 2020-12-16 DIAGNOSIS — E1140 Type 2 diabetes mellitus with diabetic neuropathy, unspecified: Secondary | ICD-10-CM | POA: Diagnosis not present

## 2020-12-16 DIAGNOSIS — E785 Hyperlipidemia, unspecified: Secondary | ICD-10-CM | POA: Diagnosis present

## 2020-12-16 DIAGNOSIS — M4316 Spondylolisthesis, lumbar region: Secondary | ICD-10-CM | POA: Diagnosis present

## 2020-12-16 DIAGNOSIS — Z9889 Other specified postprocedural states: Secondary | ICD-10-CM | POA: Diagnosis not present

## 2020-12-16 DIAGNOSIS — E871 Hypo-osmolality and hyponatremia: Secondary | ICD-10-CM | POA: Diagnosis not present

## 2020-12-16 DIAGNOSIS — R609 Edema, unspecified: Secondary | ICD-10-CM | POA: Diagnosis not present

## 2020-12-16 DIAGNOSIS — E1169 Type 2 diabetes mellitus with other specified complication: Secondary | ICD-10-CM | POA: Diagnosis not present

## 2020-12-16 DIAGNOSIS — R9431 Abnormal electrocardiogram [ECG] [EKG]: Secondary | ICD-10-CM | POA: Diagnosis not present

## 2020-12-16 DIAGNOSIS — Z794 Long term (current) use of insulin: Secondary | ICD-10-CM | POA: Diagnosis not present

## 2020-12-16 DIAGNOSIS — Z823 Family history of stroke: Secondary | ICD-10-CM

## 2020-12-16 DIAGNOSIS — K5900 Constipation, unspecified: Secondary | ICD-10-CM | POA: Diagnosis not present

## 2020-12-16 DIAGNOSIS — Z981 Arthrodesis status: Secondary | ICD-10-CM

## 2020-12-16 HISTORY — PX: OBLIQUE LUMBAR INTERBODY FUSION 1 LEVEL WITH PERCUTANEOUS SCREWS: SHX6890

## 2020-12-16 HISTORY — PX: ABDOMINAL EXPOSURE: SHX5708

## 2020-12-16 LAB — GLUCOSE, CAPILLARY
Glucose-Capillary: 147 mg/dL — ABNORMAL HIGH (ref 70–99)
Glucose-Capillary: 184 mg/dL — ABNORMAL HIGH (ref 70–99)
Glucose-Capillary: 218 mg/dL — ABNORMAL HIGH (ref 70–99)
Glucose-Capillary: 196 mg/dL — ABNORMAL HIGH (ref 70–99)

## 2020-12-16 LAB — ABO/RH: ABO/RH(D): AB POS

## 2020-12-16 LAB — CREATININE, SERUM
Creatinine, Ser: 0.7 mg/dL (ref 0.44–1.00)
GFR, Estimated: >60 mL/min (ref >60)

## 2020-12-16 LAB — CBC
HCT: 39 % (ref 36.0–46.0)
Hemoglobin: 12.8 g/dL (ref 12.0–15.0)
MCH: 28.6 pg (ref 26.0–34.0)
MCHC: 32.8 g/dL (ref 30.0–36.0)
MCV: 87.1 fL (ref 80.0–100.0)
Platelets: 304 10*3/uL (ref 150–400)
RBC: 4.48 MIL/uL (ref 3.87–5.11)
RDW: 13.2 % (ref 11.5–15.5)
WBC: 12.8 10*3/uL — ABNORMAL HIGH (ref 4.0–10.5)
nRBC: 0 % (ref 0.0–0.2)

## 2020-12-16 SURGERY — OBLIQUE LUMBAR INTERBODY FUSION 1 LEVEL WITH PERCUTANEOUS SCREWS
Anesthesia: General

## 2020-12-16 MED ORDER — ENOXAPARIN (LOVENOX) PATIENT EDUCATION KIT
PACK | Freq: Once | Status: AC
  Filled 2020-12-16: qty 1

## 2020-12-16 MED ORDER — TOPIRAMATE 25 MG PO TABS
25.0000 mg | ORAL_TABLET | Freq: Every day | ORAL | Status: DC
  Administered 2020-12-16 – 2020-12-17 (×2): 25 mg via ORAL
  Filled 2020-12-16 (×2): qty 1

## 2020-12-16 MED ORDER — SUGAMMADEX SODIUM 200 MG/2ML IV SOLN
INTRAVENOUS | Status: DC | PRN
  Administered 2020-12-16: 200 mg via INTRAVENOUS

## 2020-12-16 MED ORDER — LAMOTRIGINE 100 MG PO TABS
200.0000 mg | ORAL_TABLET | Freq: Every day | ORAL | Status: DC
  Administered 2020-12-16 – 2020-12-17 (×2): 200 mg via ORAL
  Filled 2020-12-16 (×2): qty 2

## 2020-12-16 MED ORDER — MIDAZOLAM HCL 2 MG/2ML IJ SOLN
INTRAMUSCULAR | Status: DC | PRN
  Administered 2020-12-16: 2 mg via INTRAVENOUS

## 2020-12-16 MED ORDER — PHENOL 1.4 % MT LIQD: 1.0000 | OROMUCOSAL | Status: DC | PRN

## 2020-12-16 MED ORDER — HYDROMORPHONE HCL 1 MG/ML IJ SOLN
INTRAMUSCULAR | Status: AC
  Filled 2020-12-16: qty 2

## 2020-12-16 MED ORDER — LIDOCAINE 2% (20 MG/ML) 5 ML SYRINGE
INTRAMUSCULAR | Status: AC
  Filled 2020-12-16: qty 5

## 2020-12-16 MED ORDER — PROPOFOL 10 MG/ML IV BOLUS
INTRAVENOUS | Status: DC | PRN
  Administered 2020-12-16: 140 mg via INTRAVENOUS

## 2020-12-16 MED ORDER — CHLORHEXIDINE GLUCONATE CLOTH 2 % EX PADS: 6.0000 | MEDICATED_PAD | Freq: Once | CUTANEOUS | Status: DC

## 2020-12-16 MED ORDER — SCOPOLAMINE 1 MG/3DAYS TD PT72
MEDICATED_PATCH | TRANSDERMAL | Status: DC | PRN
  Administered 2020-12-16: 1 via TRANSDERMAL

## 2020-12-16 MED ORDER — PROMETHAZINE HCL 25 MG/ML IJ SOLN: 6.2500 mg | INTRAMUSCULAR | Status: DC | PRN

## 2020-12-16 MED ORDER — ROCURONIUM BROMIDE 10 MG/ML (PF) SYRINGE
PREFILLED_SYRINGE | INTRAVENOUS | Status: DC | PRN
  Administered 2020-12-16: 70 mg via INTRAVENOUS

## 2020-12-16 MED ORDER — BUPIVACAINE LIPOSOME 1.3 % IJ SUSP
INTRAMUSCULAR | Status: DC | PRN
  Administered 2020-12-16: 10 mL

## 2020-12-16 MED ORDER — TRANEXAMIC ACID-NACL 1000-0.7 MG/100ML-% IV SOLN
INTRAVENOUS | Status: DC | PRN
  Administered 2020-12-16: 1000 mg via INTRAVENOUS

## 2020-12-16 MED ORDER — ALBUMIN HUMAN 5 % IV SOLN: INTRAVENOUS | Status: DC | PRN

## 2020-12-16 MED ORDER — ENOXAPARIN SODIUM 40 MG/0.4ML IJ SOSY: 40.0000 mg | PREFILLED_SYRINGE | INTRAMUSCULAR | 0 refills | Status: AC

## 2020-12-16 MED ORDER — SODIUM CHLORIDE 0.9 % IV SOLN
250.0000 mL | INTRAVENOUS | Status: DC
  Administered 2020-12-16: 250 mL via INTRAVENOUS

## 2020-12-16 MED ORDER — INSULIN GLARGINE 100 UNIT/ML SOLOSTAR PEN: 20.0000 [IU] | PEN_INJECTOR | Freq: Every day | SUBCUTANEOUS | Status: DC

## 2020-12-16 MED ORDER — DEXAMETHASONE SODIUM PHOSPHATE 10 MG/ML IJ SOLN
INTRAMUSCULAR | Status: DC | PRN
  Administered 2020-12-16: 10 mg via INTRAVENOUS

## 2020-12-16 MED ORDER — VANCOMYCIN HCL IN DEXTROSE 1-5 GM/200ML-% IV SOLN
1000.0000 mg | Freq: Once | INTRAVENOUS | Status: AC
  Administered 2020-12-16: 1000 mg via INTRAVENOUS

## 2020-12-16 MED ORDER — BUPIVACAINE-EPINEPHRINE (PF) 0.25% -1:200000 IJ SOLN
INTRAMUSCULAR | Status: AC
  Filled 2020-12-16: qty 30

## 2020-12-16 MED ORDER — HYDROCHLOROTHIAZIDE 25 MG PO TABS
25.0000 mg | ORAL_TABLET | Freq: Every day | ORAL | Status: DC
  Administered 2020-12-17 – 2020-12-18 (×2): 25 mg via ORAL
  Filled 2020-12-16 (×2): qty 1

## 2020-12-16 MED ORDER — PROPOFOL 10 MG/ML IV BOLUS
INTRAVENOUS | Status: AC
  Filled 2020-12-16: qty 20

## 2020-12-16 MED ORDER — CLONAZEPAM 0.5 MG PO TABS: 0.5000 mg | ORAL_TABLET | Freq: Every evening | ORAL | Status: DC | PRN

## 2020-12-16 MED ORDER — HYDROMORPHONE HCL 1 MG/ML IJ SOLN
0.5000 mg | INTRAMUSCULAR | Status: AC | PRN
Start: 2020-12-16 — End: 2020-12-17
  Administered 2020-12-16: 0.5 mg via INTRAVENOUS
  Filled 2020-12-16 (×2): qty 0.5

## 2020-12-16 MED ORDER — MEPERIDINE HCL 25 MG/ML IJ SOLN: 6.2500 mg | INTRAMUSCULAR | Status: DC | PRN

## 2020-12-16 MED ORDER — DOCUSATE SODIUM 100 MG PO CAPS
100.0000 mg | ORAL_CAPSULE | Freq: Two times a day (BID) | ORAL | Status: DC
  Administered 2020-12-16 – 2020-12-18 (×5): 100 mg via ORAL
  Filled 2020-12-16 (×5): qty 1

## 2020-12-16 MED ORDER — ONDANSETRON HCL 4 MG PO TABS: 4.0000 mg | ORAL_TABLET | Freq: Three times a day (TID) | ORAL | 0 refills | Status: AC | PRN

## 2020-12-16 MED ORDER — LACTATED RINGERS IV SOLN: INTRAVENOUS | Status: DC

## 2020-12-16 MED ORDER — ENOXAPARIN SODIUM 30 MG/0.3ML IJ SOSY
30.0000 mg | PREFILLED_SYRINGE | INTRAMUSCULAR | Status: DC
  Administered 2020-12-17: 30 mg via SUBCUTANEOUS
  Filled 2020-12-16 (×2): qty 0.3

## 2020-12-16 MED ORDER — SCOPOLAMINE 1 MG/3DAYS TD PT72
MEDICATED_PATCH | TRANSDERMAL | Status: AC
  Filled 2020-12-16: qty 1

## 2020-12-16 MED ORDER — MIDAZOLAM HCL 2 MG/2ML IJ SOLN
INTRAMUSCULAR | Status: AC
  Filled 2020-12-16: qty 2

## 2020-12-16 MED ORDER — METHOCARBAMOL 500 MG PO TABS
500.0000 mg | ORAL_TABLET | Freq: Four times a day (QID) | ORAL | Status: DC | PRN
  Administered 2020-12-16 – 2020-12-18 (×7): 500 mg via ORAL
  Filled 2020-12-16 (×7): qty 1

## 2020-12-16 MED ORDER — SENNOSIDES-DOCUSATE SODIUM 8.6-50 MG PO TABS
2.0000 | ORAL_TABLET | Freq: Every day | ORAL | Status: DC
  Administered 2020-12-16 – 2020-12-17 (×2): 2 via ORAL
  Filled 2020-12-16 (×2): qty 2

## 2020-12-16 MED ORDER — HYDROMORPHONE HCL 1 MG/ML IJ SOLN
0.2500 mg | INTRAMUSCULAR | Status: DC | PRN
  Administered 2020-12-16 (×2): 0.5 mg via INTRAVENOUS

## 2020-12-16 MED ORDER — FENTANYL CITRATE (PF) 250 MCG/5ML IJ SOLN
INTRAMUSCULAR | Status: DC | PRN
  Administered 2020-12-16: 50 ug via INTRAVENOUS
  Administered 2020-12-16: 100 ug via INTRAVENOUS
  Administered 2020-12-16 (×2): 50 ug via INTRAVENOUS

## 2020-12-16 MED ORDER — ACETAMINOPHEN 160 MG/5ML PO SOLN: 325.0000 mg | Freq: Once | ORAL | Status: DC | PRN

## 2020-12-16 MED ORDER — INSULIN ASPART 100 UNIT/ML IJ SOLN
0.0000 [IU] | Freq: Three times a day (TID) | INTRAMUSCULAR | Status: DC
  Administered 2020-12-16: 5 [IU] via SUBCUTANEOUS
  Administered 2020-12-17 (×2): 3 [IU] via SUBCUTANEOUS

## 2020-12-16 MED ORDER — ACETAMINOPHEN 650 MG RE SUPP: 650.0000 mg | RECTAL | Status: DC | PRN

## 2020-12-16 MED ORDER — PROPOFOL 500 MG/50ML IV EMUL
INTRAVENOUS | Status: DC | PRN
  Administered 2020-12-16: 25 ug/kg/min via INTRAVENOUS
  Administered 2020-12-16: 50 ug/kg/min via INTRAVENOUS

## 2020-12-16 MED ORDER — INSULIN GLARGINE-YFGN 100 UNIT/ML ~~LOC~~ SOLN
20.0000 [IU] | Freq: Every day | SUBCUTANEOUS | Status: DC
  Administered 2020-12-16 – 2020-12-17 (×2): 20 [IU] via SUBCUTANEOUS
  Filled 2020-12-16 (×3): qty 0.2

## 2020-12-16 MED ORDER — ACETAMINOPHEN 10 MG/ML IV SOLN
INTRAVENOUS | Status: DC | PRN
  Administered 2020-12-16: 1000 mg via INTRAVENOUS

## 2020-12-16 MED ORDER — SODIUM CHLORIDE 0.9% FLUSH
3.0000 mL | Freq: Two times a day (BID) | INTRAVENOUS | Status: DC
  Administered 2020-12-16 – 2020-12-17 (×4): 3 mL via INTRAVENOUS

## 2020-12-16 MED ORDER — OXYCODONE HCL 5 MG PO TABS
10.0000 mg | ORAL_TABLET | ORAL | Status: DC | PRN
Start: 2020-12-16 — End: 2020-12-18
  Administered 2020-12-16 – 2020-12-18 (×11): 10 mg via ORAL
  Filled 2020-12-16 (×11): qty 2

## 2020-12-16 MED ORDER — ACETAMINOPHEN 325 MG PO TABS: 325.0000 mg | ORAL_TABLET | Freq: Once | ORAL | Status: DC | PRN

## 2020-12-16 MED ORDER — ONDANSETRON HCL 4 MG/2ML IJ SOLN
4.0000 mg | Freq: Four times a day (QID) | INTRAMUSCULAR | Status: DC | PRN
  Administered 2020-12-17 (×2): 4 mg via INTRAVENOUS
  Filled 2020-12-16 (×2): qty 2

## 2020-12-16 MED ORDER — BUPIVACAINE HCL (PF) 0.5 % IJ SOLN
INTRAMUSCULAR | Status: DC | PRN
  Administered 2020-12-16: 20 mL

## 2020-12-16 MED ORDER — 0.9 % SODIUM CHLORIDE (POUR BTL) OPTIME
TOPICAL | Status: DC | PRN
  Administered 2020-12-16: 1000 mL

## 2020-12-16 MED ORDER — TRANEXAMIC ACID-NACL 1000-0.7 MG/100ML-% IV SOLN
INTRAVENOUS | Status: AC
  Filled 2020-12-16: qty 100

## 2020-12-16 MED ORDER — DEXAMETHASONE SODIUM PHOSPHATE 10 MG/ML IJ SOLN
INTRAMUSCULAR | Status: AC
  Filled 2020-12-16: qty 1

## 2020-12-16 MED ORDER — ENALAPRIL MALEATE 5 MG PO TABS
20.0000 mg | ORAL_TABLET | Freq: Every day | ORAL | Status: DC
  Administered 2020-12-17 – 2020-12-18 (×2): 20 mg via ORAL
  Filled 2020-12-16 (×2): qty 4
  Filled 2020-12-16: qty 1

## 2020-12-16 MED ORDER — GABAPENTIN 300 MG PO CAPS
300.0000 mg | ORAL_CAPSULE | Freq: Every day | ORAL | Status: DC
  Administered 2020-12-16 – 2020-12-17 (×2): 300 mg via ORAL
  Filled 2020-12-16 (×2): qty 1

## 2020-12-16 MED ORDER — HEPARIN 6000 UNIT IRRIGATION SOLUTION
Status: AC
  Filled 2020-12-16: qty 500

## 2020-12-16 MED ORDER — DEXMEDETOMIDINE HCL IN NACL 200 MCG/50ML IV SOLN
INTRAVENOUS | Status: DC | PRN
  Administered 2020-12-16: 8 ug via INTRAVENOUS
  Administered 2020-12-16: 4 ug via INTRAVENOUS
  Administered 2020-12-16: 8 ug via INTRAVENOUS
  Administered 2020-12-16: 4 ug via INTRAVENOUS

## 2020-12-16 MED ORDER — ORAL CARE MOUTH RINSE: 15.0000 mL | Freq: Once | OROMUCOSAL | Status: AC

## 2020-12-16 MED ORDER — INSULIN ASPART 100 UNIT/ML IJ SOLN: 0.0000 [IU] | Freq: Every day | INTRAMUSCULAR | Status: DC

## 2020-12-16 MED ORDER — PROPOFOL 1000 MG/100ML IV EMUL
INTRAVENOUS | Status: AC
  Filled 2020-12-16: qty 100

## 2020-12-16 MED ORDER — SODIUM CHLORIDE 0.9% FLUSH: 3.0000 mL | INTRAVENOUS | Status: DC | PRN

## 2020-12-16 MED ORDER — METHOCARBAMOL 1000 MG/10ML IJ SOLN
500.0000 mg | Freq: Four times a day (QID) | INTRAVENOUS | Status: DC | PRN
  Filled 2020-12-16: qty 5

## 2020-12-16 MED ORDER — CEFAZOLIN SODIUM-DEXTROSE 2-4 GM/100ML-% IV SOLN
2.0000 g | INTRAVENOUS | Status: DC
  Filled 2020-12-16: qty 100

## 2020-12-16 MED ORDER — OXYCODONE-ACETAMINOPHEN 10-325 MG PO TABS: 1.0000 | ORAL_TABLET | Freq: Four times a day (QID) | ORAL | 0 refills | Status: AC | PRN

## 2020-12-16 MED ORDER — HYDRALAZINE HCL 25 MG PO TABS
25.0000 mg | ORAL_TABLET | Freq: Three times a day (TID) | ORAL | Status: DC | PRN
  Filled 2020-12-16: qty 1

## 2020-12-16 MED ORDER — VANCOMYCIN HCL 1000 MG IV SOLR
INTRAVENOUS | Status: DC | PRN
  Administered 2020-12-16: 1000 mg via INTRAVENOUS

## 2020-12-16 MED ORDER — BUPIVACAINE-EPINEPHRINE 0.25% -1:200000 IJ SOLN
INTRAMUSCULAR | Status: DC | PRN
  Administered 2020-12-16: 10 mL

## 2020-12-16 MED ORDER — ONDANSETRON HCL 4 MG/2ML IJ SOLN
INTRAMUSCULAR | Status: AC
  Filled 2020-12-16: qty 2

## 2020-12-16 MED ORDER — AMISULPRIDE (ANTIEMETIC) 5 MG/2ML IV SOLN: 10.0000 mg | Freq: Once | INTRAVENOUS | Status: DC | PRN

## 2020-12-16 MED ORDER — VANCOMYCIN HCL 1000 MG IV SOLR
INTRAVENOUS | Status: AC
  Filled 2020-12-16: qty 1000

## 2020-12-16 MED ORDER — CHLORHEXIDINE GLUCONATE 0.12 % MT SOLN
15.0000 mL | Freq: Once | OROMUCOSAL | Status: AC
  Administered 2020-12-16: 15 mL via OROMUCOSAL
  Filled 2020-12-16: qty 15

## 2020-12-16 MED ORDER — FENTANYL CITRATE (PF) 250 MCG/5ML IJ SOLN
INTRAMUSCULAR | Status: AC
  Filled 2020-12-16: qty 5

## 2020-12-16 MED ORDER — THROMBIN 20000 UNITS EX SOLR
CUTANEOUS | Status: AC
  Filled 2020-12-16: qty 20000

## 2020-12-16 MED ORDER — THROMBIN 20000 UNITS EX SOLR: CUTANEOUS | Status: DC | PRN

## 2020-12-16 MED ORDER — VANCOMYCIN HCL IN DEXTROSE 1-5 GM/200ML-% IV SOLN
INTRAVENOUS | Status: AC
  Filled 2020-12-16: qty 200

## 2020-12-16 MED ORDER — ONDANSETRON HCL 4 MG/2ML IJ SOLN
INTRAMUSCULAR | Status: DC | PRN
Start: 2020-12-16 — End: 2020-12-16
  Administered 2020-12-16: 4 mg via INTRAVENOUS

## 2020-12-16 MED ORDER — METHOCARBAMOL 500 MG PO TABS: 500.0000 mg | ORAL_TABLET | Freq: Three times a day (TID) | ORAL | 0 refills | Status: AC | PRN

## 2020-12-16 MED ORDER — ACETAMINOPHEN 10 MG/ML IV SOLN
INTRAVENOUS | Status: AC
  Filled 2020-12-16: qty 100

## 2020-12-16 MED ORDER — LIDOCAINE 2% (20 MG/ML) 5 ML SYRINGE
INTRAMUSCULAR | Status: DC | PRN
  Administered 2020-12-16: 40 mg via INTRAVENOUS

## 2020-12-16 MED ORDER — MENTHOL 3 MG MT LOZG: 1.0000 | LOZENGE | OROMUCOSAL | Status: DC | PRN

## 2020-12-16 MED ORDER — POLYETHYLENE GLYCOL 3350 17 G PO PACK
17.0000 g | PACK | Freq: Every day | ORAL | Status: DC | PRN
  Administered 2020-12-16 – 2020-12-17 (×2): 17 g via ORAL
  Filled 2020-12-16 (×2): qty 1

## 2020-12-16 MED ORDER — DEXMEDETOMIDINE HCL IN NACL 200 MCG/50ML IV SOLN
INTRAVENOUS | Status: AC
  Filled 2020-12-16: qty 50

## 2020-12-16 MED ORDER — ONDANSETRON HCL 4 MG PO TABS: 4.0000 mg | ORAL_TABLET | Freq: Four times a day (QID) | ORAL | Status: DC | PRN

## 2020-12-16 MED ORDER — OXYCODONE HCL 5 MG PO TABS: 5.0000 mg | ORAL_TABLET | ORAL | Status: DC | PRN

## 2020-12-16 MED ORDER — ACETAMINOPHEN 10 MG/ML IV SOLN: 1000.0000 mg | Freq: Once | INTRAVENOUS | Status: DC | PRN

## 2020-12-16 MED ORDER — ROCURONIUM BROMIDE 10 MG/ML (PF) SYRINGE
PREFILLED_SYRINGE | INTRAVENOUS | Status: AC
  Filled 2020-12-16: qty 10

## 2020-12-16 MED ORDER — METFORMIN HCL 500 MG PO TABS
1000.0000 mg | ORAL_TABLET | Freq: Two times a day (BID) | ORAL | Status: DC
  Administered 2020-12-16 – 2020-12-18 (×4): 1000 mg via ORAL
  Filled 2020-12-16 (×4): qty 2

## 2020-12-16 MED ORDER — CEFAZOLIN SODIUM-DEXTROSE 1-4 GM/50ML-% IV SOLN
1.0000 g | Freq: Once | INTRAVENOUS | Status: AC
  Administered 2020-12-16: 1 g via INTRAVENOUS
  Filled 2020-12-16: qty 50

## 2020-12-16 MED ORDER — ACETAMINOPHEN 325 MG PO TABS
650.0000 mg | ORAL_TABLET | ORAL | Status: DC | PRN
  Administered 2020-12-16 – 2020-12-18 (×5): 650 mg via ORAL
  Filled 2020-12-16 (×5): qty 2

## 2020-12-16 SURGICAL SUPPLY — 104 items
ADH SKN CLS APL DERMABOND .7 (GAUZE/BANDAGES/DRESSINGS) ×1
AGENT HMST KT MTR STRL THRMB (HEMOSTASIS) ×1
APPLIER CLIP 11 MED OPEN (CLIP) ×4
APR CLP MED 11 20 MLT OPN (CLIP) ×2
BAG COUNTER SPONGE SURGICOUNT (BAG) ×4 IMPLANT
BAG SPNG CNTER NS LX DISP (BAG) ×2
BLADE CLIPPER SURG (BLADE) IMPLANT
BLADE ILLUMINATOR MIS (MISCELLANEOUS) ×2 IMPLANT
BLADE SURG 10 STRL SS (BLADE) ×2 IMPLANT
BONE MATRIX OSTEOCEL PRO MED (Bone Implant) ×2 IMPLANT
CABLE BIPOLOR RESECTION CORD (MISCELLANEOUS) ×2 IMPLANT
CATH FOLEY 2WAY SLVR  5CC 16FR (CATHETERS) ×1
CATH FOLEY 2WAY SLVR 5CC 16FR (CATHETERS) ×1 IMPLANT
CLIP APPLIE 11 MED OPEN (CLIP) ×2 IMPLANT
CLIP LIGATING EXTRA MED SLVR (CLIP) ×2 IMPLANT
CLIP LIGATING EXTRA SM BLUE (MISCELLANEOUS) ×2 IMPLANT
CLIP NEUROVISION LG (CLIP) ×2 IMPLANT
CLSR STERI-STRIP ANTIMIC 1/2X4 (GAUZE/BANDAGES/DRESSINGS) ×2 IMPLANT
COVER SURGICAL LIGHT HANDLE (MISCELLANEOUS) ×2 IMPLANT
DERMABOND ADVANCED (GAUZE/BANDAGES/DRESSINGS) ×1
DERMABOND ADVANCED .7 DNX12 (GAUZE/BANDAGES/DRESSINGS) ×1 IMPLANT
DISSECTOR BLUNT TIP ENDO 5MM (MISCELLANEOUS) ×4 IMPLANT
DRAPE C-ARM 42X72 X-RAY (DRAPES) ×2 IMPLANT
DRAPE C-ARMOR (DRAPES) ×2 IMPLANT
DRAPE INCISE IOBAN 66X45 STRL (DRAPES) IMPLANT
DRSG OPSITE POSTOP 3X4 (GAUZE/BANDAGES/DRESSINGS) ×2 IMPLANT
DRSG OPSITE POSTOP 4X10 (GAUZE/BANDAGES/DRESSINGS) ×2 IMPLANT
DRSG OPSITE POSTOP 4X6 (GAUZE/BANDAGES/DRESSINGS) ×4 IMPLANT
DRSG OPSITE POSTOP 4X8 (GAUZE/BANDAGES/DRESSINGS) ×2 IMPLANT
DURAPREP 26ML APPLICATOR (WOUND CARE) ×2 IMPLANT
ELECT BLADE 4.0 EZ CLEAN MEGAD (MISCELLANEOUS) ×2
ELECT BLADE 6.5 EXT (BLADE) ×2 IMPLANT
ELECT CAUTERY BLADE 6.4 (BLADE) ×2 IMPLANT
ELECT PENCIL ROCKER SW 15FT (MISCELLANEOUS) ×2 IMPLANT
ELECT REM PT RETURN 9FT ADLT (ELECTROSURGICAL) ×2
ELECTRODE BLDE 4.0 EZ CLN MEGD (MISCELLANEOUS) ×1 IMPLANT
ELECTRODE REM PT RTRN 9FT ADLT (ELECTROSURGICAL) ×1 IMPLANT
GAUZE 4X4 16PLY ~~LOC~~+RFID DBL (SPONGE) IMPLANT
GLOVE SRG 8 PF TXTR STRL LF DI (GLOVE) ×1 IMPLANT
GLOVE SURG ENC MOIS LTX SZ6.5 (GLOVE) ×2 IMPLANT
GLOVE SURG ENC MOIS LTX SZ7.5 (GLOVE) ×2 IMPLANT
GLOVE SURG MICRO LTX SZ7.5 (GLOVE) ×2 IMPLANT
GLOVE SURG MICRO LTX SZ8.5 (GLOVE) ×2 IMPLANT
GLOVE SURG UNDER POLY LF SZ6.5 (GLOVE) ×2 IMPLANT
GLOVE SURG UNDER POLY LF SZ8 (GLOVE) ×2
GLOVE SURG UNDER POLY LF SZ8.5 (GLOVE) ×2 IMPLANT
GOWN STRL REUS W/ TWL LRG LVL3 (GOWN DISPOSABLE) ×2 IMPLANT
GOWN STRL REUS W/ TWL XL LVL3 (GOWN DISPOSABLE) ×2 IMPLANT
GOWN STRL REUS W/TWL 2XL LVL3 (GOWN DISPOSABLE) ×4 IMPLANT
GOWN STRL REUS W/TWL LRG LVL3 (GOWN DISPOSABLE) ×4
GOWN STRL REUS W/TWL XL LVL3 (GOWN DISPOSABLE) ×4
GUIDEWIRE NITINOL BEVEL TIP (WIRE) ×10 IMPLANT
HEMOSTAT SNOW SURGICEL 2X4 (HEMOSTASIS) IMPLANT
INSERT FOGARTY 61MM (MISCELLANEOUS) IMPLANT
INSERT FOGARTY SM (MISCELLANEOUS) IMPLANT
KIT BASIN OR (CUSTOM PROCEDURE TRAY) ×2 IMPLANT
KIT TURNOVER KIT B (KITS) ×2 IMPLANT
LOOP VESSEL MAXI BLUE (MISCELLANEOUS) IMPLANT
LOOP VESSEL MINI RED (MISCELLANEOUS) IMPLANT
MODULE EMG NEEDLE SSEP NVM5 (NEEDLE) ×2 IMPLANT
MODULE NVM5 NEXT GEN EMG (NEEDLE) ×2 IMPLANT
NEEDLE 22X1 1/2 (OR ONLY) (NEEDLE) ×2 IMPLANT
NEEDLE SPNL 18GX3.5 QUINCKE PK (NEEDLE) ×2 IMPLANT
NS IRRIG 1000ML POUR BTL (IV SOLUTION) ×2 IMPLANT
PACK LAMINECTOMY ORTHO (CUSTOM PROCEDURE TRAY) ×2 IMPLANT
PACK UNIVERSAL I (CUSTOM PROCEDURE TRAY) ×2 IMPLANT
PAD ARMBOARD 7.5X6 YLW CONV (MISCELLANEOUS) ×8 IMPLANT
PROBE BALL TIP NVM5 SNG USE (BALLOONS) ×2 IMPLANT
ROD RELINE MAS TI LORD 5.5X40 (Rod) ×4 IMPLANT
SCREW LOCK RELINE 5.5 TULIP (Screw) ×8 IMPLANT
SCREW RELINE MAS POLY 6.5X40MM (Screw) ×10 IMPLANT
SPACER RISE 18X45MM 7-14MM (Spacer) ×2 IMPLANT
SPONGE INTESTINAL PEANUT (DISPOSABLE) ×2 IMPLANT
SPONGE SURGIFOAM ABS GEL 100 (HEMOSTASIS) ×2 IMPLANT
SPONGE T-LAP 18X18 ~~LOC~~+RFID (SPONGE) ×2 IMPLANT
SPONGE T-LAP 4X18 ~~LOC~~+RFID (SPONGE) ×2 IMPLANT
STAPLER VISISTAT 35W (STAPLE) ×2 IMPLANT
SUCTION FRAZIER TIP 8 FR DISP (SUCTIONS) ×1
SUCTION TUBE FRAZIER 8FR DISP (SUCTIONS) ×1 IMPLANT
SURGIFLO W/THROMBIN 8M KIT (HEMOSTASIS) ×2 IMPLANT
SUT BONE WAX W31G (SUTURE) IMPLANT
SUT MNCRL AB 3-0 PS2 27 (SUTURE) ×8 IMPLANT
SUT PDS AB 1 CTX 36 (SUTURE) ×4 IMPLANT
SUT PROLENE 4 0 RB 1 (SUTURE)
SUT PROLENE 4-0 RB1 .5 CRCL 36 (SUTURE) IMPLANT
SUT PROLENE 5 0 C 1 24 (SUTURE) ×2 IMPLANT
SUT PROLENE 5 0 CC1 (SUTURE) IMPLANT
SUT PROLENE 6 0 C 1 30 (SUTURE) ×2 IMPLANT
SUT PROLENE 6 0 CC (SUTURE) IMPLANT
SUT SILK 0 TIES 10X30 (SUTURE) ×2 IMPLANT
SUT SILK 2 0 TIES 10X30 (SUTURE) ×6 IMPLANT
SUT SILK 2 0SH CR/8 30 (SUTURE) IMPLANT
SUT SILK 3 0 TIES 10X30 (SUTURE) ×2 IMPLANT
SUT SILK 3 0 TIES 17X18 (SUTURE) ×2
SUT SILK 3 0SH CR/8 30 (SUTURE) IMPLANT
SUT SILK 3-0 18XBRD TIE BLK (SUTURE) ×1 IMPLANT
SUT VIC AB 1 CT1 18XCR BRD 8 (SUTURE) ×2 IMPLANT
SUT VIC AB 1 CT1 8-18 (SUTURE) ×4
SUT VIC AB 2-0 CT1 18 (SUTURE) ×4 IMPLANT
SYR BULB IRRIG 60ML STRL (SYRINGE) ×2 IMPLANT
TAPE CLOTH 4X10 WHT NS (GAUZE/BANDAGES/DRESSINGS) ×6 IMPLANT
TOWEL GREEN STERILE (TOWEL DISPOSABLE) ×4 IMPLANT
TOWEL GREEN STERILE FF (TOWEL DISPOSABLE) ×2 IMPLANT
WATER STERILE IRR 1000ML POUR (IV SOLUTION) ×2 IMPLANT

## 2020-12-16 NOTE — Discharge Instructions (Signed)

## 2020-12-16 NOTE — OR Nursing (Signed)
Dr. Register called stating no FB noted in xray. Dr.Brooks made aware.

## 2020-12-16 NOTE — Op Note (Signed)
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  Posterior supplemental pedicle screw fusion L4-5  SURGEON:  Venita Lick, MD  PHYSICIAN ASSISTANT: Voncille Lo, PA  Approach surgeon: Dr. Sherald Hess  ANESTHESIA:   General  EBL: 150 cc  Cell Saver:  Neuro monitoring: All 4 pedicles were directly stimulated there is no adverse activity greater than 4 mg.  No abnormal free running EMG activity throughout the course of the case.  Complications: None  Implants: Globus rise-L oblique interbody cage.  18 x 45 mm length.  Expanded the cage from 7 mm to approximately 13 mm height.  Graft: Osteo cell  BRIEF HISTORY: Sherry Moore is a 75 y.o. female who presents with significant back buttock and radicular leg pain.  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  As result of the failure conservative management to improve her quality of life and the severe pain we elected to move forward with surgery.  All appropriate risks, benefits, and alternatives to surgery were discussed with the patient consent was obtained  PROCEDURE DETAILS: Patient was brought into the operating room and was properly positioned on the operating room table, and teds and SCDs were applied.  After induction with general anesthesia the patient was endotracheally intubated.  A Foley and the intraoperative neuro monitoring needles were applied by the rep. Patient was turned into the lateral decubitus position and an axillary roll and bony prominences were well-padded.  The patient was secured directly to the table with tape to prevent rotation during the surgery.  The posterior lumbar spine left abdominal  flank and anterior abdomen were all prepped and draped in a standard fashion.A timeout was taken to confirm all important data: Including patient, procedure, and the level.  At this point time Dr. Chestine Spore performed a standard oblique lumbar approach to the lumbar spine.  Please refer to his dictation for specifics on the approach.  Once the appropriate retractors were placed he then scrubbed out and I scrubbed into the case.  We again confirmed the level with fluoroscopy.  An annulotomy was performed and then I used pituitary rongeurs curettes and Kerrison rongeurs to remove the disc material.  I continue to work posteriorly removing the osteophyte.  I then used the oyster shaped Cobb elevator to release the contralateral annulus.  I then used a box osteotomes to remove the remaining cartilaginous endplate and post bleeding subchondral bone.  I then used the trialing devices to obtain the appropriate length.  I then inserted the expandable cage that was packed with the allograft and confirmed in both the AP and lateral planes that it was well positioned.  Once this was done I then expanded the cage to approximately 13 mm height.  I had excellent fixation in both the AP and lateral planes.  The foraminal stenosis that was present with on her preoperative x-rays had completely resolved and there is excellent restoration of intraforaminal and lateral recess space.  At this point I then backfilled the cage with the remaining allograft.  After copious irrigation I confirmed hemostasis using electrocautery, TXA, and Floseal.  The the retractors were then removed and the fascia of the external oblique was reapproximated with a running #1 PDS stitch.  The a AP spot lateral x-ray  was then taken and read by the radiologist confirming that there were no retained surgical instruments in the wound.   After confirmation from the radiologist we then closed with a running 0 Vicryl suture, interrupted 2-0 Vicryl suture, and a 3-0  Monocryl for the skin.  At this point identified that he L4 pedicle on the AP fluoroscopy view and marked the planned incision site.  Small stab incision was made and I advanced the Jamshidi needle percutaneously to the lateral aspect of the L4 pedicle.  Once I confirmed proper position I advanced the Jamshidi needle using the AP fluoroscopy view and real-time neuro monitoring.  As I neared the medial wall of the pedicle I confirmed that I was just beyond the posterior wall of the vertebral body in the lateral view.  Once this was done I advanced into the vertebral body.  I then cannulated the pedicle.  I repeated this exact same technique and procedure on the contralateral side and at the L5 pedicle.once all 4 pedicles were cannulated I then measured and obtained the appropriate sized pedicle screws.  6.5 x 40 mm length screws were then advanced over the guidepin.  All 4 screws had excellent purchase.  I then directly stimulated each of the 4 pedicle screws and there is no adverse activity at greater than 40 mA.  I then measured and placed the appropriate size rod (40 mm length).  I then placed the locking caps to secure the rod in place.  All 4 locking caps were then locked and then tightened/torqued according to manufacture standards.  Final imaging demonstrated satisfactory position of the pedicle screws and the rods in both the AP and lateral planes.  The insertion tabs were removed and all the wounds were irrigated with normal saline. Closed in a layered fashion with interrupted #1 Vicryl suture, 2-0 Vicryl suture, and a 3-0 Monocryl.  Steri-Strips and dry dressings were applied and the patient was ultimately extubated and transferred to the PACU without incident.  At the end of the case the needle and sponge counts were correct.    Venita Lick, MD 12/16/2020 11:44 AM

## 2020-12-16 NOTE — Anesthesia Procedure Notes (Signed)
Anesthesia Regional Block: TAP block   Pre-Anesthetic Checklist: , timeout performed,  Correct Patient, Correct Site, Correct Laterality,  Correct Procedure, Correct Position, site marked,  Risks and benefits discussed,  Surgical consent,  Pre-op evaluation,  At surgeon's request and post-op pain management  Laterality: Left  Prep: chloraprep       Needles:  Injection technique: Single-shot  Needle Type: Echogenic Stimulator Needle     Needle Length: 9cm  Needle Gauge: 21     Additional Needles:   Procedures:,,,, ultrasound used (permanent image in chart),,    Narrative:  Start time: 12/16/2020 7:00 AM End time: 12/16/2020 7:05 AM Injection made incrementally with aspirations every 5 mL.  Performed by: Personally  Anesthesiologist: Shelton Silvas, MD  Additional Notes: Patient tolerated the procedure well. Local anesthetic introduced in an incremental fashion under minimal resistance after negative aspirations. No paresthesias were elicited. After completion of the procedure, no acute issues were identified and patient continued to be monitored by RN.

## 2020-12-16 NOTE — Brief Op Note (Signed)
12/16/2020  12:04 PM  PATIENT:  Sherry Moore  75 y.o. 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 CLARK TO DO APPROACH 3C-BED TAP BLOCK WITH EXPAREL ABDOMINAL EXPOSURE (N/A)  SURGEON:  Surgeon(s) and Role: Panel 1:    Venita Lick, MD - Primary Panel 2:    * Cephus Shelling, MD - Primary  PHYSICIAN ASSISTANT: Voncille Lo, PA  ASSISTANTS: Voncille Lo, PA  ANESTHESIA:   general  EBL:  100 mL   BLOOD ADMINISTERED:none  DRAINS: none   LOCAL MEDICATIONS USED:  MARCAINE     SPECIMEN:  No Specimen  DISPOSITION OF SPECIMEN:  N/A  COUNTS:  YES  TOURNIQUET:  * No tourniquets in log *  DICTATION: .Dragon Dictation  PLAN OF CARE: Admit to inpatient   PATIENT DISPOSITION:  PACU - hemodynamically stable.

## 2020-12-16 NOTE — Progress Notes (Signed)
Pharmacy Antibiotic Note  Sherry Moore is a 75 y.o. female admitted on 12/16/2020 with surgical prophylaxis.  Pharmacy has been consulted for vancomycin dosing.   No drain per RN. No noted allergies, confirmed with patient. Will change from vancomycin to cefazolin.   Plan: Cefazolin 1g x1   Height: 5\' 6"  (167.6 cm) Weight: 84.2 kg (185 lb 10 oz) IBW/kg (Calculated) : 59.3  Temp (24hrs), Avg:98 F (36.7 C), Min:97.6 F (36.4 C), Max:98.6 F (37 C)  Recent Labs  Lab 12/13/20 1319  WBC 7.9  CREATININE 0.83    Estimated Creatinine Clearance: 66 mL/min (by C-G formula based on SCr of 0.83 mg/dL).    No Known Allergies  02/12/21, PharmD, BCPS, BCCP Clinical Pharmacist  Please check AMION for all [facility] Pharmacy phone numbers After 10:00 PM, call Main Pharmacy [phone]

## 2020-12-16 NOTE — Anesthesia Procedure Notes (Signed)
Procedure Name: Intubation Date/Time: 12/16/2020 7:49 AM Performed by: Shary Decamp, CRNA Pre-anesthesia Checklist: Patient identified, Patient being monitored, Timeout performed, Emergency Drugs available and Suction available Patient Re-evaluated:Patient Re-evaluated prior to induction Oxygen Delivery Method: Circle System Utilized Preoxygenation: Pre-oxygenation with 100% oxygen Induction Type: IV induction Ventilation: Mask ventilation without difficulty Laryngoscope Size: Miller and 2 Grade View: Grade I Tube type: Oral Tube size: 7.0 mm Number of attempts: 1 Airway Equipment and Method: Stylet Placement Confirmation: ETT inserted through vocal cords under direct vision, positive ETCO2 and breath sounds checked- equal and bilateral Secured at: 21 cm Tube secured with: Tape Dental Injury: Teeth and Oropharynx as per pre-operative assessment

## 2020-12-16 NOTE — H&P (Signed)
Addendum H&P: There has been no change in the patient's clinical exam since her last office visit of 12/06/2020.  She continues to have severe back buttock and neuropathic leg pain.  Despite appropriate conservative management her quality of life is continued to deteriorate we have elected to move forward with an oblique lumbar interbody fusion at L4-5 with supplemental posterior pedicle screw fixation I have gone over the risks, benefits, and alternatives of surgery with the patient in great detail and all of her questions were encouraged and addressed.  She is elected to move forward with surgery.

## 2020-12-16 NOTE — Anesthesia Postprocedure Evaluation (Signed)
Anesthesia Post Note  Patient: Sherry Moore  Procedure(s) Performed: OBLIQUE LUMBAR INTERBODY FUSION 1 LEVEL L4-5 WITH POSTERIOR SPINAL FUSION INTERBODY L4-5 ABDOMINAL EXPOSURE     Patient location during evaluation: PACU Anesthesia Type: General Level of consciousness: awake and alert Pain management: pain level controlled Vital Signs Assessment: post-procedure vital signs reviewed and stable Respiratory status: spontaneous breathing, nonlabored ventilation, respiratory function stable and patient connected to nasal cannula oxygen Cardiovascular status: blood pressure returned to baseline and stable Postop Assessment: no apparent nausea or vomiting Anesthetic complications: no   No notable events documented.  Last Vitals:  Vitals:   12/16/20 1330 12/16/20 1345  BP: (!) 173/67 (!) 170/66  Pulse: 84 88  Resp: 12 14  Temp:  36.6 C  SpO2: 99% 98%    Last Pain:  Vitals:   12/16/20 1345  TempSrc:   PainSc: 2                  Shelton Silvas

## 2020-12-16 NOTE — H&P (Signed)
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  The various methods of treatment have been discussed with the patient and family. After consideration of risks, benefits and other options for treatment, the patient has consented to  Procedure(s) with comments: OBLIQUE LUMBAR INTERBODY FUSION 1 LEVEL L4-5 WITH POSTERIOR SPINAL FUSION INTERBODY L4-5 (N/A) - 4 HRS DR. Vernita Tague TO DO APPROACH 3C-BED TAP BLOCK WITH EXPAREL ABDOMINAL EXPOSURE (N/A) as a surgical intervention.  The patient's history has been reviewed, patient examined, no change in status, stable for surgery.  I have reviewed the patient's chart and labs.  Questions were answered to the patient's satisfaction.    L4-L5 OLIF  Cephus Shelling  [facility] [place]   Patient name: Sherry Moore     MRN: [id number]        DOB: [birth year]          Sex: female   REASON FOR CONSULT: Discuss exposure for OLIF with Dr. Shon Baton with Dr. Shon Baton   HPI: Sherry Moore is a 75 y.o. female, who is here today for discussion of exposure for spinal fusion.  She reports that she has had difficulty with back pain for nearly 10 years.  This is been increasingly severe over the past several years.  She has had 1 session of spine injections with no relief.  She has seen Dr. Shon Baton who is recommended fusion at the 4 5 level oblique lateral approach.  She has multiple medical issues as listed below.         Past Medical History:  Diagnosis Date   Acquired spondylolisthesis     Anemia     Atherosclerosis     Bipolar depression (HCC)     Chronic back pain     Degeneration of lumbar intervertebral disc     Depression     Diabetes mellitus without complication (HCC)      Type 1   Hand swelling     Headache     Hyperlipidemia     Hypertension     Joint pain     Lumbar pain     Numbness and tingling      Osteoarthritis     Osteoarthritis of knee 07/24/2017   Right knee pain     Spondylolisthesis, grade 1     STD (female)      History of   Stroke (HCC)     Swelling of both lower extremities             Family History  Problem Relation Age of Onset   Arthritis Mother     Stroke Mother     Diabetes Mother     Hypertension Mother     Arthritis Father     Diabetes Father     Heart disease Sister     Arthritis Sister     Diabetes Sister     Hypertension Sister     Hypertension Brother     Heart disease Brother     Diabetes Brother        SOCIAL HISTORY: Social History         Socioeconomic History   Marital status: Divorced      Spouse name: Not on file   Number of children: Not on file   Years of education: Not on file   Highest education level: Not on file  Occupational History  Occupation: RETIRED NURSE  Tobacco Use   Smoking status: Never Smoker   Smokeless tobacco: Never Used  Substance and Sexual Activity   Alcohol use: Not Currently   Drug use: Yes      Types: Marijuana   Sexual activity: Not on file  Other Topics Concern   Not on file  Social History Narrative   Not on file    Social Determinants of Health    Financial Resource Strain: Not on file  Food Insecurity: Not on file  Transportation Needs: Not on file  Physical Activity: Not on file  Stress: Not on file  Social Connections: Not on file  Intimate Partner Violence: Not on file      No Known Allergies         Current Outpatient Medications  Medication Sig Dispense Refill   acyclovir (ZOVIRAX) 400 MG tablet Take 400 mg by mouth 5 (five) times daily.       aspirin 325 MG EC tablet Take by mouth.       atorvastatin (LIPITOR) 20 MG tablet Take by mouth.       calcium-vitamin D (OSCAL WITH D) 500-200 MG-UNIT tablet Take 1 tablet by mouth.       cholecalciferol (VITAMIN D3) 25 MCG (1000 UNIT) tablet Take 1,000 Units by mouth daily.       clonazePAM (KLONOPIN) 1 MG tablet Take by mouth.        ENALAPRIL MALEATE PO Take 20 mg by mouth in the morning and at bedtime.       gabapentin (NEURONTIN) 300 MG capsule Take 300 mg by mouth at bedtime.       hydrochlorothiazide (HYDRODIURIL) 25 MG tablet Take by mouth.       insulin glargine (LANTUS) 100 UNIT/ML Solostar Pen Inject 16 Units into the skin daily.       lamoTRIgine (LAMICTAL) 200 MG tablet Take 200 mg by mouth daily.       Magnesium Citrate 200 MG TABS Take by mouth.       metFORMIN (GLUCOPHAGE) 1000 MG tablet Take 1,000 mg by mouth 2 (two) times daily with a meal.       topiramate (TOPAMAX) 25 MG tablet Take by mouth.        No current facility-administered medications for this visit.      REVIEW OF SYSTEMS:  [X]  denotes positive finding, [ ]  denotes negative finding Cardiac   Comments:  Chest pain or chest pressure:      Shortness of breath upon exertion:      Short of breath when lying flat:      Irregular heart rhythm:             Vascular      Pain in calf, thigh, or hip brought on by ambulation:      Pain in feet at night that wakes you up from your sleep:      Blood clot in your veins:      Leg swelling:             Pulmonary      Oxygen at home:      Productive cough:      Wheezing:             Neurologic      Sudden weakness in arms or legs:      Sudden numbness in arms or legs:      Sudden onset of difficulty speaking or slurred speech:  Temporary loss of vision in one eye:      Problems with dizziness:             Gastrointestinal      Blood in stool:      Vomited blood:             Genitourinary      Burning when urinating:      Blood in urine:             Psychiatric      Major depression:             Hematologic      Bleeding problems:      Problems with blood clotting too easily:             Skin      Rashes or ulcers:             Constitutional      Fever or chills:          PHYSICAL EXAM:    Vitals:    09/27/20 0951  BP: (!) 155/66  Pulse: 87  Resp: 16  Temp: 97.9  F (36.6 C)  TempSrc: Other (Comment)  SpO2: 98%  Weight: 185 lb (83.9 kg)  Height: 5\' 6"  (1.676 m)      GENERAL: The patient is a well-nourished female, in no acute distress. The vital signs are documented above. CARDIOVASCULAR: 2+ radial and 2+ dorsalis pedis pulses bilaterally. PULMONARY: There is good air exchange MUSCULOSKELETAL: There are no major deformities or cyanosis. NEUROLOGIC: No focal weakness or paresthesias are detected. SKIN: There are no ulcers or rashes noted. PSYCHIATRIC: The patient has a normal affect.   DATA:  MRI and plain films of the spine were reviewed.  She has a normal location of her aortic and vena caval bifurcation.  She does not have a CT scan of her spine.  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  Explained that Dr. has determined her best option for control of her pain is surgery and one component is from the oblique approach.  I explained our role in providing exposure.  I do feel she is an acceptable risk for surgery.   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   She is not obese.  BMI is 2.9   She has had prior laparoscopic cholecystectomy and vaginal hysterectomy.  I explained that neither of these should have a bearing on her retroperitoneal exposure   She is planning on having her surgery in July.  A date has not been set.  Her sister will come to help with her recovery.  I explained that I would no longer be doing surgery at [facility] beginning in July.  I explained that Dr. August would be doing her surgery.  I will provide him with her disc so he can review her films as well.     Chestine Spore, MD FACS [facility]  [facility] [phone] Pager [phone]   Note: Portions of this report may have been transcribed using voice recognition software.  Every effort has been made to ensure accuracy; however, inadvertent computerized transcription errors may still be present.

## 2020-12-16 NOTE — Transfer of Care (Signed)
Immediate Anesthesia Transfer of Care Note  Patient: Sherry Moore  Procedure(s) Performed: OBLIQUE LUMBAR INTERBODY FUSION 1 LEVEL L4-5 WITH POSTERIOR SPINAL FUSION INTERBODY L4-5 ABDOMINAL EXPOSURE  Patient Location: PACU  Anesthesia Type:General  Level of Consciousness: drowsy, patient cooperative and responds to stimulation  Airway & Oxygen Therapy: Patient Spontanous Breathing and Patient connected to nasal cannula oxygen  Post-op Assessment: Report given to RN and Post -op Vital signs reviewed and stable  Post vital signs: Reviewed and stable  Last Vitals:  Vitals Value Taken Time  BP 137/67 12/16/20 1231  Temp    Pulse 79 12/16/20 1235  Resp 15 12/16/20 1235  SpO2 97 % 12/16/20 1235  Vitals shown include unvalidated device data.  Last Pain:  Vitals:   12/16/20 0601  TempSrc:   PainSc: 3       Patients Stated Pain Goal: 2 (12/16/20 0601)  Complications: No notable events documented.

## 2020-12-16 NOTE — Op Note (Signed)
Date: December 16, 2020  Preoperative diagnosis: Chronic lower back pain  Postoperative diagnosis: Same  Procedure: Abdominal exposure at the L4-L5 disc space using oblique exposure for L4-L5 OLIF  Surgeon: Dr. Cephus Shelling, MD  Co-surgeon: Dr. Venita Lick, MD  Assistant: Voncille Lo  Indications: Patient is a 75 year old female who has chronic lower back pain.  She has been evaluate by Dr. Shon Baton who recommended L4-L5 OLIF with pedicle screws.  Vascular surgery was asked to assist with the abdominal exposure through an oblique approach at the L4-L5 disc space.  She presents after risk benefits discussed.  Findings: The L4-5 disc space was marked over the oblique muscles on the left abdominal wall with her in the right lateral position.  Ultimately oblique incision was made and a muscle-sparing technique was used to dissect through the obliques and ultimately the peritoneum and left ureter was mobilized across midline and then I mobilized the left psoas lateral to the disc space.  I did have to ligate 1 segmental branch over the disc space.  The L4-L5 disc space was then fully exposed from an oblique approach and the globus fixed retractor was placed with a needle in the disc space and we confirmed we were at the correct level.  Anesthesia: General  Details: Patient was taken to the operating room after informed consent was obtained.  Placed on the operative table in the supine position.  General endotracheal anesthesia was induced.  She was then rolled in the right lateral position and all pressure points were padded.  She was secured to the bed appropriately.  Fluoroscopic C-arm was brought in the lateral position and we marked the L4-5 disc space over the left abdominal wall over her oblique muscles.  Abdominal wall, flank, and back were then prepped and draped.  Timeout was performed.  Initially made an oblique incision over the left oblique muscles.  Dissected with Bovie cautery  through the subcutaneous tissue and used cerebellar retractors for added visualization.  I opened the external oblique fascia with Bovie cautery and then uses muscle-sparing technique to dissect between all the layers of the oblique muscles until we entered the retroperitoneum and identified the peritoneum that was then swept bluntly as well as the left ureter to the midline.  I then used hand-held Wiley retractors for added visualization and ultimately I used Kd and a laparoscopic dissector to mobilize the left psoas lateral to the disc space and also mobilized the left iliac artery and vein closer to the midline.  I did have to ligate one segmental branch between 2-0 silk ties and clips and this was divided.  Once I felt I had good exposure of the disc base by mobilizing the vessels as well as the left psoas the globus fixed retractor was placed on the bed.  I placed a 160 lip to the midline of the disc space as well as two 160 lip retractors lateral to the disc space to get adequate exposure.  Needle was placed in the disc space and we used fluoroscopic C-arm to confirm we were at the correct level.  Case was turned over to Dr. Shon Baton.  Complication: None  Condition: Stable  Cephus Shelling, MD [facility] [place] Office: [phone]   Cephus Shelling

## 2020-12-17 ENCOUNTER — Inpatient Hospital Stay (HOSPITAL_COMMUNITY): Payer: Medicare Other

## 2020-12-17 DIAGNOSIS — R609 Edema, unspecified: Secondary | ICD-10-CM

## 2020-12-17 DIAGNOSIS — Z9889 Other specified postprocedural states: Secondary | ICD-10-CM

## 2020-12-17 LAB — GLUCOSE, CAPILLARY
Glucose-Capillary: 114 mg/dL — ABNORMAL HIGH (ref 70–99)
Glucose-Capillary: 154 mg/dL — ABNORMAL HIGH (ref 70–99)
Glucose-Capillary: 199 mg/dL — ABNORMAL HIGH (ref 70–99)
Glucose-Capillary: 186 mg/dL — ABNORMAL HIGH (ref 70–99)

## 2020-12-17 MED ORDER — MORPHINE SULFATE (PF) 4 MG/ML IV SOLN
4.0000 mg | INTRAVENOUS | Status: AC | PRN
  Administered 2020-12-17 – 2020-12-18 (×2): 4 mg via INTRAVENOUS
  Filled 2020-12-17 (×2): qty 1

## 2020-12-17 MED ORDER — BISACODYL 10 MG RE SUPP
10.0000 mg | Freq: Every day | RECTAL | Status: DC | PRN
  Administered 2020-12-17: 10 mg via RECTAL
  Filled 2020-12-17: qty 1

## 2020-12-17 MED FILL — Heparin Sodium (Porcine) Inj 1000 Unit/ML: INTRAMUSCULAR | Qty: 30 | Status: AC

## 2020-12-17 MED FILL — Sodium Chloride IV Soln 0.9%: INTRAVENOUS | Qty: 1000 | Status: AC

## 2020-12-17 NOTE — Progress Notes (Signed)
Bilateral lower extremity venous duplex completed. Refer to "CV Proc" under chart review to view preliminary results.  12/17/2020 11:32 AM Eula Fried., MHA, RVT, RDCS, RDMS

## 2020-12-17 NOTE — Evaluation (Addendum)
Occupational Therapy Evaluation and Discharge Patient Details Name: Sherry Moore MRN: [id number] DOB: [birth year] Today's Date: 12/17/2020    History of Present Illness The pt is a 75 yo female presenting 8/4 for oblique lumbar interbody fusion at L4-5 with supplemental posterior pedicle screw fixation due to chronic pain in buttock and PMH includes: HTn, anxiety, depression, bipolar disorder, prior CVA, GERD, and arthritis.   Clinical Impression   This 75 yo female admitted and underwent above presents to acute OT with PLOF of living alone and being independent with basic ADLs and IADLs. All education completed with pt for post op back and ADLs, we will sign off.    Follow Up Recommendations  No OT follow up;Supervision - Intermittent    Equipment Recommendations  None recommended by OT       Precautions / Restrictions Precautions Precautions: Back;Fall Precaution Booklet Issued: Yes (comment) Precaution Comments: Can have brace off for quick trips to bathroom and not wear in bed at all Required Braces or Orthoses: Spinal Brace Spinal Brace: Lumbar corset;Applied in sitting position Restrictions Weight Bearing Restrictions: No      Mobility Bed Mobility Overal bed mobility: Modified Independent             General bed mobility comments: Use or rail and increased time; HOB flat    Transfers Overall transfer level: Needs assistance Equipment used: Rolling walker (2 wheeled) Transfers: Sit to/from Stand Sit to Stand: Supervision              Balance Overall balance assessment: Mild deficits observed, not formally tested (feels more safe with RW)                                         ADL either performed or assessed with clinical judgement   ADL Overall ADL's : Needs assistance/impaired Eating/Feeding: Independent;Sitting   Grooming: Set up;Standing   Upper Body Bathing: Set up;Sitting   Lower Body Bathing: Minimal assistance Lower  Body Bathing Details (indicate cue type and reason): S sit<>stand; pt has a long handled sponge at home Upper Body Dressing : Set up;Sitting   Lower Body Dressing: Set up Lower Body Dressing Details (indicate cue type and reason): S sit<>stand. Pt has a long shoe horn at home. Pt will get a reacher and sock aid on her own. Toilet Transfer: Supervision/safety;Ambulation;RW;Grab bars;Comfort height toilet   Toileting- Clothing Manipulation and Hygiene: Independent Toileting - Clothing Manipulation Details (indicate cue type and reason): S sit<>stand       General ADL Comments: Pt educated on sit<>stand stance for keeping back more straight, use of 2 cups for brushing teeth, use of wet wipes for back peri care, pillow placement when laying bed Educated on side stepping into tub.     Vision Patient Visual Report: No change from baseline              Pertinent Vitals/Pain Pain Assessment: Faces Faces Pain Scale: Hurts even more Pain Location: incision, bilateral thigh Pain Descriptors / Indicators: Discomfort;Grimacing Pain Intervention(s): Limited activity within patient's tolerance;Monitored during session;Repositioned;Patient requesting pain meds-RN notified;RN gave pain meds during session     Hand Dominance Right   Extremity/Trunk Assessment Upper Extremity Assessment Upper Extremity Assessment: Overall WFL for tasks assessed      Communication Communication Communication: No difficulties   Cognition Arousal/Alertness: Awake/alert Behavior During Therapy: WFL for tasks assessed/performed Overall Cognitive Status: Within Functional  Limits for tasks assessed                                                Home Living Family/patient expects to be discharged to:: Private residence Living Arrangements: Alone Available Help at Discharge: Friend(s);Available PRN/intermittently Type of Home: House Home Access: Stairs to enter [employer] of  Steps: 1 Entrance Stairs-Rails: None Home Layout: One level     Bathroom Shower/Tub: IT trainer: Standard     Home Equipment: Hand held shower head;Grab bars - tub/shower          Prior Functioning/Environment Level of Independence: Independent                OT Problem List: Decreased range of motion;Impaired balance (sitting and/or standing);Pain      OT Treatment/Interventions:      OT Goals(Current goals can be found in the care plan section) Acute Rehab OT Goals Patient Stated Goal: to go home tomorrow   AM-PAC OT "6 Clicks" Daily Activity     Outcome Measure Help from another person eating meals?: None Help from another person taking care of personal grooming?: None Help from another person toileting, which includes using toliet, bedpan, or urinal?: None Help from another person bathing (including washing, rinsing, drying)?: A Little Help from another person to put on and taking off regular upper body clothing?: None Help from another person to put on and taking off regular lower body clothing?: A Little 6 Click Score: 22   End of Session Equipment Utilized During Treatment: Rolling walker;Back brace Nurse Communication:  (need for RW, no need for HHOT)  Activity Tolerance: Patient tolerated treatment well Patient left:  (in bathroom)  OT Visit Diagnosis: Pain Pain - part of body:  (incisional)                Time: 5916-3846 OT Time Calculation (min): 28 min Charges:  OT General Charges $OT Visit: 1 Visit OT Evaluation $OT Eval Moderate Complexity: 1 Mod OT Treatments $Self Care/Home Management : 8-22 mins  Ignacia Palma, OTR/L Acute [employer] Pager [phone] Office [phone]  Evette Georges 12/17/2020, 10:09 AM

## 2020-12-17 NOTE — Progress Notes (Addendum)
  Progress Note    12/17/2020 8:34 AM 1 Day Post-Op  Subjective:  no major complaints. Has some mobility questions for PT. Ambulating in room when I entered   Vitals:   12/17/20 0329 12/17/20 0753  BP: (!) 135/46 (!) 148/61  Pulse: 79 87  Resp: 18 18  Temp: 98.9 F (37.2 C) 98.7 F (37.1 C)  SpO2: 97% 98%   Physical Exam: Cardiac:  regular Lungs:  non labored Incisions:  LLQ incision honeycomb dressings intact, clean and dry. No swelling or hematoma Extremities:  well perfused and warm with palpable DP pulses Abdomen:  obese, soft, non tender Neurologic: alert and oriented  CBC    Component Value Date/Time   WBC 12.8 (H) 12/16/2020 1459   RBC 4.48 12/16/2020 1459   HGB 12.8 12/16/2020 1459   HCT 39.0 12/16/2020 1459   PLT 304 12/16/2020 1459   MCV 87.1 12/16/2020 1459   MCH 28.6 12/16/2020 1459   MCHC 32.8 12/16/2020 1459   RDW 13.2 12/16/2020 1459    BMET    Component Value Date/Time   NA 137 12/13/2020 1319   K 4.1 12/13/2020 1319   CL 99 12/13/2020 1319   CO2 27 12/13/2020 1319   GLUCOSE 111 (H) 12/13/2020 1319   BUN 11 12/13/2020 1319   CREATININE 0.70 12/16/2020 1459   CALCIUM 9.8 12/13/2020 1319   GFRNONAA >60 12/16/2020 1459    INR    Component Value Date/Time   INR 1.0 12/13/2020 1319     Intake/Output Summary (Last 24 hours) at 12/17/2020 0834 Last data filed at 12/16/2020 1345 Gross per 24 hour  Intake 1550 ml  Output 1210 ml  Net 340 ml     Assessment/Plan:  75 y.o. female is s/p  Abdominal exposure at the L4-L5 disc space using oblique exposure for L4-L5 OLIF   Doing well post op Pain overall well controlled Ambulating well Incision is intact and well appearing Bilateral lower extremities well perfused and warm with palpable DP She is stable from vascular standpoint She will follow up with [place] as needed   Graceann Congress, PA-C [facility] [phone] 12/17/2020 8:34 AM  I have seen and evaluated the  patient. I agree with the PA note as documented above.  Postop day 1 status post L4-L5 OLIF.  Abdomen is nice and soft.  Appropriate postop incisional tenderness.  Left DP palpable.  Tolerating p.o.  No concerns from vascular standpoint.  Cephus Shelling, MD [facility] [place] Office: [phone]

## 2020-12-17 NOTE — Progress Notes (Signed)
Subjective: 1 Day Post-Op Procedure(s) (LRB): OBLIQUE LUMBAR INTERBODY FUSION 1 LEVEL L4-5 WITH POSTERIOR SPINAL FUSION INTERBODY L4-5 (N/A) ABDOMINAL EXPOSURE (N/A) Patient reports pain as moderate.   Leg pain improved. Tolerating PO without nausea or vomiting. +void -flatus, -BM +ambulation  Objective: Vital signs in last 24 hours: Temp:  [97.6 F (36.4 C)-98.9 F (37.2 C)] 98.9 F (37.2 C) (08/05 0329) Pulse Rate:  [75-92] 79 (08/05 0329) Resp:  [12-19] 18 (08/05 0329) BP: (132-173)/(46-71) 135/46 (08/05 0329) SpO2:  [94 %-100 %] 97 % (08/05 0329)  Intake/Output from previous day: 08/04 0701 - 08/05 0700 In: 2050 [I.V.:1300; IV Piggyback:750] Out: 1410 [Urine:1310; Blood:100] Intake/Output this shift: No intake/output data recorded.  Recent Labs    12/16/20 1459  HGB 12.8   Recent Labs    12/16/20 1459  WBC 12.8*  RBC 4.48  HCT 39.0  PLT 304   Recent Labs    12/16/20 1459  CREATININE 0.70   No results for input(s): LABPT, INR in the last 72 hours.  Neurologically intact ABD soft Neurovascular intact Sensation intact distally Intact pulses distally Dorsiflexion/Plantar flexion intact Incision: dressing C/D/I Compartment soft   Assessment/Plan: 1 Day Post-Op Procedure(s) (LRB): OBLIQUE LUMBAR INTERBODY FUSION 1 LEVEL L4-5 WITH POSTERIOR SPINAL FUSION INTERBODY L4-5 (N/A) ABDOMINAL EXPOSURE (N/A) Advance diet Up with therapy Encourage IS DVT ppx: Teds, SCDs, ambulation, will start lovenox. Will get LE doppler to r/o DVT per protocol.  Plan D/C tomorrow if +flatus.      Rhodia Albright 12/17/2020, 7:17 AM

## 2020-12-17 NOTE — Evaluation (Signed)
Physical Therapy Evaluation Patient Details Name: Sherry Moore MRN: [id number] DOB: 27-Jun-1945 Today's Date: 12/17/2020   History of Present Illness  The pt is a 75 yo female presenting 8/4 for oblique lumbar interbody fusion at L4-5 with supplemental posterior pedicle screw fixation due to chronic pain in buttock and PMH includes: HTn, anxiety, depression, bipolar disorder, prior CVA, GERD, and arthritis.   Clinical Impression  Pt in bed upon arrival of PT, agreeable to evaluation at this time. Prior to admission the pt was mobilizing independently, but reports progressive limitation by pain. The pt now presents with limitations in functional mobility, strength, power, stability, and activity tolerance due to above dx and resulting pain, and will continue to benefit from skilled PT to address these deficits. The pt was able to demo good independence with bed mobility without need for cues or physical assist, and completed multiple sit-stand transfers from EOB without physical assist to power up. The pt was then able to complete good hallway ambulation with use of RW and no instability or LOB, but was limited in stair navigation by pain in RLE. The pt was educated in progressive walking program for home, and expressed understanding, may benefit from OPPT when cleared by MD if strength deficits in BLE persist.      Follow Up Recommendations Outpatient PT;Supervision - Intermittent (OPPT once cleared by MD)    Equipment Recommendations  Rolling walker with 5" wheels    Recommendations for Other Services       Precautions / Restrictions Precautions Precautions: Back;Fall Precaution Booklet Issued: Yes (comment) Precaution Comments: Can have brace off for quick trips to bathroom and not wear in bed at all Required Braces or Orthoses: Spinal Brace Spinal Brace: Lumbar corset;Applied in sitting position Restrictions Weight Bearing Restrictions: No      Mobility  Bed Mobility Overal bed  mobility: Modified Independent Bed Mobility: Sidelying to Sit;Sit to Sidelying   Sidelying to sit: Supervision     Sit to sidelying: Supervision General bed mobility comments: Use or rail and increased time; HOB flat    Transfers Overall transfer level: Needs assistance Equipment used: Rolling walker (2 wheeled) Transfers: Sit to/from Stand Sit to Stand: Supervision         General transfer comment: supervision during session for safety, pt able to complete with great technique and safety with use of RW  Ambulation/Gait Ambulation/Gait assistance: Supervision Gait Distance (Feet): 200 Feet Assistive device: Rolling walker (2 wheeled) Gait Pattern/deviations: Step-through pattern;Decreased stride length Gait velocity: 0.5 m/s Gait velocity interpretation: 1.31 - 2.62 ft/sec, indicative of limited community ambulator General Gait Details: pt with milt trunk flexion, but was able to demo improved posture  when cued. slow but steady with no overt LOB  Stairs Stairs: Yes Stairs assistance: Min assist Stair Management: One rail Right;Step to pattern Number of Stairs: 1 General stair comments: 1 step with R on rail, L HHA with minA. the pt wa limited mostly by pain     Balance Overall balance assessment: Mild deficits observed, not formally tested (feels more safe with RW)                                           Pertinent Vitals/Pain Pain Assessment: Faces Faces Pain Scale: Hurts even more Pain Location: incision, bilateral thigh Pain Descriptors / Indicators: Discomfort;Grimacing Pain Intervention(s): Limited activity within patient's tolerance;Monitored during session;Repositioned;Patient requesting pain  meds-RN notified;RN gave pain meds during session    Home Living Family/patient expects to be discharged to:: Private residence Living Arrangements: Alone Available Help at Discharge: Friend(s);Available PRN/intermittently Type of Home: House Home  Access: Stairs to enter Entrance Stairs-Rails: None Entrance Stairs-Number of Steps: 1 Home Layout: One level Home Equipment: Hand held shower head;Grab bars - tub/shower      Prior Function Level of Independence: Independent         Comments: independent, progressivel limitation by pain     Hand Dominance   Dominant Hand: Right    Extremity/Trunk Assessment   Upper Extremity Assessment Upper Extremity Assessment: Overall WFL for tasks assessed    Lower Extremity Assessment Lower Extremity Assessment: RLE deficits/detail RLE Deficits / Details: grossly 4/5, limited by pain. ankel DF 5/5 RLE: Unable to fully assess due to pain RLE Sensation: WNL    Cervical / Trunk Assessment Cervical / Trunk Assessment: Normal;Other exceptions Cervical / Trunk Exceptions: s/p spine surgery  Communication   Communication: No difficulties  Cognition Arousal/Alertness: Awake/alert Behavior During Therapy: WFL for tasks assessed/performed Overall Cognitive Status: Within Functional Limits for tasks assessed                                        General Comments General comments (skin integrity, edema, etc.): VSS oN RA, pt with good recall of precautions    Exercises Other Exercises Other Exercises: 5xsit-stand   Assessment/Plan    PT Assessment Patient needs continued PT services  PT Problem List Decreased strength;Decreased activity tolerance;Decreased balance;Decreased mobility;Pain       PT Treatment Interventions DME instruction;Stair training;Gait training;Functional mobility training;Therapeutic activities;Therapeutic exercise;Balance training    PT Goals (Current goals can be found in the Care Plan section)  Acute Rehab PT Goals Patient Stated Goal: to go home tomorrow PT Goal Formulation: With patient Time For Goal Achievement: 12/31/20 Potential to Achieve Goals: Good    Frequency Min 5X/week   Barriers to discharge           AM-PAC PT  "6 Clicks" Mobility  Outcome Measure Help needed turning from your back to your side while in a flat bed without using bedrails?: None Help needed moving from lying on your back to sitting on the side of a flat bed without using bedrails?: None Help needed moving to and from a bed to a chair (including a wheelchair)?: None Help needed standing up from a chair using your arms (e.g., wheelchair or bedside chair)?: None Help needed to walk in hospital room?: A Little Help needed climbing 3-5 steps with a railing? : A Little 6 Click Score: 22    End of Session Equipment Utilized During Treatment: Gait belt;Back brace Activity Tolerance: Patient tolerated treatment well;Patient limited by fatigue Patient left: in bed;with call bell/phone within reach Nurse Communication: Mobility status PT Visit Diagnosis: Other abnormalities of gait and mobility (R26.89);Muscle weakness (generalized) (M62.81);Pain    Time: 0093-8182 PT Time Calculation (min) (ACUTE ONLY): 15 min   Charges:   PT Evaluation $PT Eval Low Complexity: 1 Low          Crescent Gotham [place] Hun, PT, DPT   Acute Rehabilitation Department Pager #: [phone]  Gaetana Michaelis 12/17/2020, 10:16 AM

## 2020-12-18 LAB — GLUCOSE, CAPILLARY: Glucose-Capillary: 178 mg/dL — ABNORMAL HIGH (ref 70–99)

## 2020-12-18 NOTE — Progress Notes (Signed)
Patient alert and oriented, voiding adequately, skin clean, dry and intact without evidence of skin break down, or symptoms of complications - no redness or edema noted, only slight tenderness at site.  Patient states pain is manageable at time of discharge. Patient has an appointment with MD in 2 weeks 

## 2020-12-18 NOTE — Plan of Care (Signed)
Adequately Ready for Discharge 

## 2020-12-18 NOTE — Progress Notes (Signed)
PT Cancellation Note and Discharge  Patient Details Name: Sherry Moore MRN: [id number] DOB: 11/15/1945   Cancelled Treatment:    Reason Eval/Treat Not Completed: Pt ambulating in the hall independently on PT arrival. Discussed precautions, safety, brace application/wearing schedule and car transfer. Pt reports she does not need further PT services at this time, and agree that pt is mobilizing well without further therapy intervention. Will sign off at this time.    Marylynn Pearson 12/18/2020, 10:02 AM  Conni Slipper, PT, DPT Acute Rehabilitation Services Pager: [phone] Office: [phone]

## 2020-12-18 NOTE — Progress Notes (Signed)
    Subjective: Procedure(s) (LRB): OBLIQUE LUMBAR INTERBODY FUSION 1 LEVEL L4-5 WITH POSTERIOR SPINAL FUSION INTERBODY L4-5 (N/A) ABDOMINAL EXPOSURE (N/A) 2 Days Post-Op  Patient reports pain as 1 on 0-10 scale.  Reports decreased leg pain reports incisional back pain   Positive void Negative bowel movement Negative flatus Positive chest pain or shortness of breath  Objective: Vital signs in last 24 hours: Temp:  [97.9 F (36.6 C)-99.7 F (37.6 C)] 98.2 F (36.8 C) (08/06 0801) Pulse Rate:  [75-107] 102 (08/06 0801) Resp:  [18] 18 (08/06 0801) BP: (127-158)/(47-65) 147/52 (08/06 0801) SpO2:  [95 %-100 %] 97 % (08/06 0801)  Intake/Output from previous day: 08/05 0701 - 08/06 0700 In: 240 [P.O.:240] Out: -   Labs: Recent Labs    12/16/20 1459  WBC 12.8*  RBC 4.48  HCT 39.0  PLT 304   Recent Labs    12/16/20 1459  CREATININE 0.70   No results for input(s): LABPT, INR in the last 72 hours.  Physical Exam: Neurologically intact ABD soft Intact pulses distally Incision: dressing C/D/I Compartment soft Body mass index is 29.96 kg/m.   Assessment/Plan: Patient stable  Doppler study of the lower extremities negative for DVT Continue mobilization with physical therapy Continue care  Patient doing very well.  She reports being constipated prior to surgery.  States that she has very little pain she is ambulating and that she would like to be discharged and she will manage her constipation at home.  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  We will plan on discharge today with appropriate medications which will include Percocet for pain control, Robaxin as a muscle relaxer, Lovenox for 10 days, and Zofran for nausea.   Venita Lick, MD [facility] [phone]

## 2020-12-19 ENCOUNTER — Inpatient Hospital Stay (HOSPITAL_COMMUNITY)
Admission: EM | Admit: 2020-12-19 | Discharge: 2020-12-20 | DRG: 394 | Disposition: A | Discharge status: Home / Self Care | Payer: Medicare Other | Attending: Orthopedic Surgery | Admitting: Orthopedic Surgery

## 2020-12-19 ENCOUNTER — Inpatient Hospital Stay (HOSPITAL_COMMUNITY)
Admission: AD | Admit: 2020-12-19 | Payer: PRIVATE HEALTH INSURANCE | Source: Other Acute Inpatient Hospital | Admitting: Orthopedic Surgery

## 2020-12-19 ENCOUNTER — Encounter (HOSPITAL_COMMUNITY): Payer: Self-pay

## 2020-12-19 ENCOUNTER — Inpatient Hospital Stay (HOSPITAL_COMMUNITY): Payer: Medicare Other

## 2020-12-19 DIAGNOSIS — Z8249 Family history of ischemic heart disease and other diseases of the circulatory system: Secondary | ICD-10-CM

## 2020-12-19 DIAGNOSIS — Z794 Long term (current) use of insulin: Secondary | ICD-10-CM | POA: Diagnosis not present

## 2020-12-19 DIAGNOSIS — F419 Anxiety disorder, unspecified: Secondary | ICD-10-CM | POA: Diagnosis present

## 2020-12-19 DIAGNOSIS — E1169 Type 2 diabetes mellitus with other specified complication: Secondary | ICD-10-CM | POA: Diagnosis present

## 2020-12-19 DIAGNOSIS — Z8673 Personal history of transient ischemic attack (TIA), and cerebral infarction without residual deficits: Secondary | ICD-10-CM | POA: Diagnosis not present

## 2020-12-19 DIAGNOSIS — I10 Essential (primary) hypertension: Secondary | ICD-10-CM | POA: Diagnosis present

## 2020-12-19 DIAGNOSIS — Z79899 Other long term (current) drug therapy: Secondary | ICD-10-CM | POA: Diagnosis not present

## 2020-12-19 DIAGNOSIS — Z7902 Long term (current) use of antithrombotics/antiplatelets: Secondary | ICD-10-CM | POA: Diagnosis not present

## 2020-12-19 DIAGNOSIS — K567 Ileus, unspecified: Secondary | ICD-10-CM | POA: Diagnosis present

## 2020-12-19 DIAGNOSIS — E119 Type 2 diabetes mellitus without complications: Secondary | ICD-10-CM

## 2020-12-19 DIAGNOSIS — K5900 Constipation, unspecified: Secondary | ICD-10-CM | POA: Diagnosis not present

## 2020-12-19 DIAGNOSIS — M549 Dorsalgia, unspecified: Secondary | ICD-10-CM | POA: Diagnosis present

## 2020-12-19 DIAGNOSIS — R748 Abnormal levels of other serum enzymes: Secondary | ICD-10-CM | POA: Diagnosis present

## 2020-12-19 DIAGNOSIS — F3130 Bipolar disorder, current episode depressed, mild or moderate severity, unspecified: Secondary | ICD-10-CM | POA: Diagnosis present

## 2020-12-19 DIAGNOSIS — G8929 Other chronic pain: Secondary | ICD-10-CM | POA: Diagnosis present

## 2020-12-19 DIAGNOSIS — E785 Hyperlipidemia, unspecified: Secondary | ICD-10-CM | POA: Diagnosis present

## 2020-12-19 DIAGNOSIS — K9189 Other postprocedural complications and disorders of digestive system: Secondary | ICD-10-CM | POA: Diagnosis not present

## 2020-12-19 DIAGNOSIS — E871 Hypo-osmolality and hyponatremia: Secondary | ICD-10-CM | POA: Diagnosis present

## 2020-12-19 DIAGNOSIS — Z823 Family history of stroke: Secondary | ICD-10-CM

## 2020-12-19 DIAGNOSIS — E860 Dehydration: Secondary | ICD-10-CM | POA: Diagnosis present

## 2020-12-19 DIAGNOSIS — Z20822 Contact with and (suspected) exposure to covid-19: Secondary | ICD-10-CM | POA: Diagnosis present

## 2020-12-19 DIAGNOSIS — R109 Unspecified abdominal pain: Secondary | ICD-10-CM | POA: Diagnosis not present

## 2020-12-19 DIAGNOSIS — M1710 Unilateral primary osteoarthritis, unspecified knee: Secondary | ICD-10-CM | POA: Diagnosis present

## 2020-12-19 DIAGNOSIS — R9431 Abnormal electrocardiogram [ECG] [EKG]: Secondary | ICD-10-CM | POA: Diagnosis present

## 2020-12-19 DIAGNOSIS — Z833 Family history of diabetes mellitus: Secondary | ICD-10-CM

## 2020-12-19 DIAGNOSIS — R1084 Generalized abdominal pain: Secondary | ICD-10-CM

## 2020-12-19 DIAGNOSIS — E1140 Type 2 diabetes mellitus with diabetic neuropathy, unspecified: Secondary | ICD-10-CM

## 2020-12-19 DIAGNOSIS — Z90710 Acquired absence of both cervix and uterus: Secondary | ICD-10-CM

## 2020-12-19 DIAGNOSIS — Z981 Arthrodesis status: Secondary | ICD-10-CM

## 2020-12-19 DIAGNOSIS — Z7984 Long term (current) use of oral hypoglycemic drugs: Secondary | ICD-10-CM

## 2020-12-19 LAB — CBC WITH DIFFERENTIAL/PLATELET
Abs Immature Granulocytes: 0.04 10*3/uL (ref 0.00–0.07)
Basophils Absolute: 0 10*3/uL (ref 0.0–0.1)
Basophils Relative: 1 %
Eosinophils Absolute: 0.1 10*3/uL (ref 0.0–0.5)
Eosinophils Relative: 1 %
HCT: 34.3 % — ABNORMAL LOW (ref 36.0–46.0)
Hemoglobin: 11.5 g/dL — ABNORMAL LOW (ref 12.0–15.0)
Immature Granulocytes: 1 %
Lymphocytes Relative: 16 %
Lymphs Abs: 1.2 10*3/uL (ref 0.7–4.0)
MCH: 29.6 pg (ref 26.0–34.0)
MCHC: 33.5 g/dL (ref 30.0–36.0)
MCV: 88.2 fL (ref 80.0–100.0)
Monocytes Absolute: 0.9 10*3/uL (ref 0.1–1.0)
Monocytes Relative: 13 %
Neutro Abs: 5 10*3/uL (ref 1.7–7.7)
Neutrophils Relative %: 68 %
Platelets: 318 10*3/uL (ref 150–400)
RBC: 3.89 MIL/uL (ref 3.87–5.11)
RDW: 13.1 % (ref 11.5–15.5)
WBC: 7.3 10*3/uL (ref 4.0–10.5)
nRBC: 0 % (ref 0.0–0.2)

## 2020-12-19 LAB — HEMOGLOBIN A1C
Hgb A1c MFr Bld: 6.8 % — ABNORMAL HIGH (ref 4.8–5.6)
Mean Plasma Glucose: 148.46 mg/dL

## 2020-12-19 LAB — CBG MONITORING, ED: Glucose-Capillary: 91 mg/dL (ref 70–99)

## 2020-12-19 LAB — COMPREHENSIVE METABOLIC PANEL
ALT: 31 U/L (ref 0–44)
AST: 39 U/L (ref 15–41)
Albumin: 3.1 g/dL — ABNORMAL LOW (ref 3.5–5.0)
Alkaline Phosphatase: 35 U/L — ABNORMAL LOW (ref 38–126)
Anion gap: 9 (ref 5–15)
BUN: 8 mg/dL (ref 8–23)
CO2: 26 mmol/L (ref 22–32)
Calcium: 8.6 mg/dL — ABNORMAL LOW (ref 8.9–10.3)
Chloride: 100 mmol/L (ref 98–111)
Creatinine, Ser: 0.6 mg/dL (ref 0.44–1.00)
GFR, Estimated: >60 mL/min (ref >60)
Glucose, Bld: 120 mg/dL — ABNORMAL HIGH (ref 70–99)
Potassium: 4 mmol/L (ref 3.5–5.1)
Sodium: 135 mmol/L (ref 135–145)
Total Bilirubin: 1 mg/dL (ref 0.3–1.2)
Total Protein: 5.6 g/dL — ABNORMAL LOW (ref 6.5–8.1)

## 2020-12-19 LAB — MAGNESIUM: Magnesium: 1.9 mg/dL (ref 1.7–2.4)

## 2020-12-19 LAB — OSMOLALITY, URINE: Osmolality, Ur: 423 mOsm/kg (ref 300–900)

## 2020-12-19 LAB — OSMOLALITY: Osmolality: 281 mOsm/kg (ref 275–295)

## 2020-12-19 LAB — CK: Total CK: 704 U/L — ABNORMAL HIGH (ref 38–234)

## 2020-12-19 LAB — PHOSPHORUS: Phosphorus: 2.7 mg/dL (ref 2.5–4.6)

## 2020-12-19 LAB — SODIUM, URINE, RANDOM: Sodium, Ur: 80 mmol/L

## 2020-12-19 LAB — CREATININE, URINE, RANDOM: Creatinine, Urine: 48.85 mg/dL

## 2020-12-19 MED ORDER — HYDROMORPHONE HCL 1 MG/ML IJ SOLN
0.5000 mg | INTRAMUSCULAR | Status: DC | PRN
Start: 2020-12-19 — End: 2020-12-20

## 2020-12-19 MED ORDER — OXYCODONE-ACETAMINOPHEN 5-325 MG PO TABS
1.0000 | ORAL_TABLET | ORAL | Status: DC | PRN
  Administered 2020-12-20: 2 via ORAL
  Administered 2020-12-20: 1 via ORAL
  Filled 2020-12-19 (×3): qty 1

## 2020-12-19 MED ORDER — TRAMADOL HCL 50 MG PO TABS
50.0000 mg | ORAL_TABLET | Freq: Four times a day (QID) | ORAL | Status: DC | PRN
  Administered 2020-12-19: 50 mg via ORAL
  Filled 2020-12-19: qty 1

## 2020-12-19 MED ORDER — INSULIN ASPART 100 UNIT/ML IJ SOLN
0.0000 [IU] | INTRAMUSCULAR | Status: DC
  Administered 2020-12-20 (×3): 1 [IU] via SUBCUTANEOUS

## 2020-12-19 MED ORDER — DOCUSATE SODIUM 100 MG PO CAPS
100.0000 mg | ORAL_CAPSULE | Freq: Every day | ORAL | Status: DC
  Administered 2020-12-19: 100 mg via ORAL
  Filled 2020-12-19 (×2): qty 1

## 2020-12-19 MED ORDER — SODIUM CHLORIDE 0.9 % IV SOLN: INTRAVENOUS | Status: DC

## 2020-12-19 MED ORDER — ENOXAPARIN SODIUM 40 MG/0.4ML IJ SOSY
40.0000 mg | PREFILLED_SYRINGE | Freq: Every day | INTRAMUSCULAR | Status: DC
  Administered 2020-12-19: 40 mg via SUBCUTANEOUS
  Filled 2020-12-19: qty 0.4

## 2020-12-19 MED ORDER — POLYETHYLENE GLYCOL 3350 17 G PO PACK
17.0000 g | PACK | Freq: Two times a day (BID) | ORAL | Status: DC
  Administered 2020-12-20: 17 g via ORAL
  Filled 2020-12-19: qty 1

## 2020-12-19 MED ORDER — MILK AND MOLASSES ENEMA
1.0000 | Freq: Once | RECTAL | Status: AC
  Administered 2020-12-20: 240 mL via RECTAL
  Filled 2020-12-19: qty 240

## 2020-12-19 MED ORDER — IOHEXOL 300 MG/ML  SOLN
100.0000 mL | Freq: Once | INTRAMUSCULAR | Status: AC | PRN
  Administered 2020-12-19: 100 mL via INTRAVENOUS

## 2020-12-19 MED ORDER — FAMOTIDINE IN NACL 20-0.9 MG/50ML-% IV SOLN
20.0000 mg | Freq: Once | INTRAVENOUS | Status: AC
  Administered 2020-12-19: 20 mg via INTRAVENOUS
  Filled 2020-12-19: qty 50

## 2020-12-19 MED ORDER — FENTANYL CITRATE (PF) 100 MCG/2ML IJ SOLN: 50.0000 ug | Freq: Once | INTRAMUSCULAR | Status: DC

## 2020-12-19 MED ORDER — HYDROMORPHONE HCL 1 MG/ML IJ SOLN
0.5000 mg | Freq: Once | INTRAMUSCULAR | Status: AC
  Administered 2020-12-19: 0.5 mg via INTRAVENOUS
  Filled 2020-12-19: qty 1

## 2020-12-19 MED ORDER — METOCLOPRAMIDE HCL 5 MG/ML IJ SOLN: 5.0000 mg | Freq: Four times a day (QID) | INTRAMUSCULAR | Status: DC | PRN

## 2020-12-19 NOTE — ED Notes (Signed)
Pt able to use bedside commode.  

## 2020-12-19 NOTE — H&P (Addendum)
H&P  Patient is being admitted S/P lumbar fusion with abdominal pain/possible ileus.   Subjective:  Chief Complaint: Abdominal pain s/p lumbar fusion on 12/16/20  HPI: Sherry Moore is a 75 y.o. female who presented on 12/16/2020 with significant back buttock and radicular leg pain. 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  As result of the failure conservative management to improve her quality of life and the severe pain we elected to move forward with surgery.  All appropriate risks, benefits, and alternatives to surgery were discussed with the patient consent was obtained. Patient underwent oblique lumbar interbody fusion, 1 level, L4-5, with posterior spinal fusion interbody L4-5, via abdominal exposure. She is 3 days post-op today.   She was discharged on 12/18/2020, and at the time of discharge still had not had a BM. The patient admitted to having constipation prior to the operation, and despite the lack of BM and Dr. Shon Baton voicing concern regarding the potential for an ileus, patient preferred to try and manage her constipation at home.   Today the patient presented to [facility] with continued complaints of constipation/abdominal pain. Discussed with ED provider over the phone, and plan for transfer to [facility] was initiated.   Patient Active Problem List   Diagnosis Date Noted   S/P lumbar fusion 12/16/2020    Past Medical History:  Diagnosis Date   Acquired spondylolisthesis    Anemia    Anxiety    Atherosclerosis    Bipolar depression (HCC)    Type 2   Chronic back pain    Degeneration of lumbar intervertebral disc    Depression    Diabetes mellitus without complication (HCC)    Type 2   Hand swelling    Headache    Hyperlipidemia    Hypertension    Joint pain    Lumbar pain    Numbness and tingling    Osteoarthritis    Osteoarthritis of knee 07/24/2017   Right knee  pain    Spondylolisthesis, grade 1    STD (female)    History of    Stroke [facility])    TIA   Swelling of both lower extremities     Past Surgical History:  Procedure Laterality Date   ABDOMINAL HYSTERECTOMY  05/16/2015   CARPAL TUNNEL RELEASE  05/15/2009   CATARACT EXTRACTION  05/15/2016   CHOLECYSTECTOMY  05/16/2007   COLONOSCOPY  05/15/2014   CRANIOTOMY Left 2015   HAND SURGERY  05/16/2011   TUBAL LIGATION  05/15/1976    Prior to Admission medications   Medication Sig Start Date End Date Taking? Authorizing Provider  acetaminophen (TYLENOL) 500 MG tablet Take 1,000 mg by mouth every 8 (eight) hours as needed for moderate pain.    [provider]  acyclovir (ZOVIRAX) 400 MG tablet Take 400 mg by mouth daily.    [provider]  atorvastatin (LIPITOR) 20 MG tablet Take 20 mg by mouth at bedtime. 04/23/17   [provider]  b complex vitamins capsule Take 1 capsule by mouth daily.    [provider]  cholecalciferol (VITAMIN D3) 25 MCG (1000 UNIT) tablet Take 1,000 Units by mouth daily.    [provider]  clonazePAM (KLONOPIN) 1 MG tablet Take 0.5-1 mg by mouth at bedtime as needed for anxiety (sleep).    [provider]  enalapril (VASOTEC) 20 MG tablet Take 20 mg by mouth in the morning and at bedtime.  [provider]  enoxaparin (LOVENOX) 40 MG/0.4ML injection Inject 0.4 mLs (40 mg total) into the skin daily for 10 days. 10 day supply 1 injection per day 12/16/20 12/26/20  Venita Lick, MD  gabapentin (NEURONTIN) 300 MG capsule Take 300 mg by mouth at bedtime.    [provider]  hydrALAZINE (APRESOLINE) 25 MG tablet Take 25 mg by mouth 3 (three) times daily as needed (high blood pressure).    [provider]  hydrochlorothiazide (HYDRODIURIL) 25 MG tablet Take 25 mg by mouth daily. 04/23/17   [provider]  insulin glargine (LANTUS) 100 UNIT/ML Solostar Pen Inject 20 Units into the skin  at bedtime.    [provider]  lamoTRIgine (LAMICTAL) 200 MG tablet Take 200 mg by mouth at bedtime.    [provider]  MAGNESIUM CITRATE PO Take 400 mg by mouth in the morning and at bedtime.    [provider]  metFORMIN (GLUCOPHAGE) 1000 MG tablet Take 1,000 mg by mouth 2 (two) times daily with a meal.    [provider]  methocarbamol (ROBAXIN) 500 MG tablet Take 1 tablet (500 mg total) by mouth every 8 (eight) hours as needed for up to 5 days for muscle spasms. 12/16/20 12/21/20  Venita Lick, MD  omeprazole (PRILOSEC) 40 MG capsule Take 40 mg by mouth daily.    [provider]  ondansetron (ZOFRAN) 4 MG tablet Take 1 tablet (4 mg total) by mouth every 8 (eight) hours as needed for nausea or vomiting. 12/16/20   Venita Lick, MD  oxyCODONE-acetaminophen (PERCOCET) 10-325 MG tablet Take 1 tablet by mouth every 6 (six) hours as needed for up to 5 days for pain. 12/16/20 12/21/20  Venita Lick, MD  topiramate (TOPAMAX) 25 MG tablet Take 25 mg by mouth at bedtime.    [provider]    No Known Allergies  Social History   Socioeconomic History   Marital status: Divorced    Spouse name: Not on file   Number of children: Not on file   Years of education: Not on file   Highest education level: Not on file  Occupational History   Occupation: RETIRED NURSE  Tobacco Use   Smoking status: Never   Smokeless tobacco: Never  Vaping Use   Vaping Use: Never used  Substance and Sexual Activity   Alcohol use: Not Currently   Drug use: Not Currently    Types: Marijuana   Sexual activity: Not on file  Other Topics Concern   Not on file  Social History Narrative   Not on file   Social Determinants of Health   Financial Resource Strain: Not on file  Food Insecurity: Not on file  Transportation Needs: Not on file  Physical Activity: Not on file  Stress: Not on file  Social Connections: Not on file  Intimate Partner Violence: Not on file     Tobacco Use: Low Risk    Smoking Tobacco Use: Never   Smokeless Tobacco Use: Never   Social History   Substance and Sexual Activity  Alcohol Use Not Currently    Family History  Problem Relation Age of Onset   Arthritis Mother    Stroke Mother    Diabetes Mother    Hypertension Mother    Arthritis Father    Diabetes Father    Heart disease Sister    Arthritis Sister    Diabetes Sister    Hypertension Sister    Hypertension Brother    Heart disease  Brother    Diabetes Brother     ROS: Constitutional: no fever, no chills, no night sweats, no significant weight loss Cardiovascular: no chest pain, no palpitations Respiratory: no cough, no shortness of breath, No COPD Gastrointestinal: abdominal pain/pressure, no vomiting, no nausea Musculoskeletal: incisional pain, back pain Neurologic: no numbness, no tingling, no difficulty with balance   Objective:  Physical Exam:   Vital signs in last 24 hours: Temp:  [98.7 F (37.1 C)] 98.7 F (37.1 C) (08/07 1654) Pulse Rate:  [86-100] 89 (08/07 1745) Resp:  [13-17] 16 (08/07 1745) BP: (138-161)/(56-78) 138/64 (08/07 1745) SpO2:  [94 %-99 %] 94 % (08/07 1745) Weight:  [83.9 kg] 83.9 kg (08/07 1706)  Imaging Review   Assessment: Abdominal pain s/p Lumbar fusion POD#3 Plan: - Admitting patient  - Consulting hospitalist team for evaluation - Discussed ordering CT Scan with ED provider - will proceed.  - Dr. Shon Baton aware of situation - he will see patient tomorrow.   Nelia Shi, MBA, PA-C Orthopedic Surgery [facility] [place]     Agree with above.

## 2020-12-19 NOTE — ED Triage Notes (Signed)
Pt was transferred by paramedics from [facility] in [place]. Pt presents with abdominal pain complaining of leaky stool and new incontinence after being discharged yesterday from [facility] for a lumbar fusion surgery on  12/16/20. Pt states that the [facility] diagnosed her with paralytic ileus. She complained of feeling like she is "going to pop" from the sharp pressure in her stomach, passing flatulence but no stool since coming to the ED

## 2020-12-19 NOTE — ED Provider Notes (Signed)
[facility] EMERGENCY [facility] Provider Note   CSN: [id number] Arrival date & time: 12/19/20  1644     History Chief Complaint  Patient presents with   Abdominal Pain    Sherry Moore is a 75 y.o. female with a history of diabetes mellitus, hypertension, hyperlipidemia, prior stroke, and chronic back pain who is status post lumbar fusion procedure by orthopedic surgeon Dr. Shon BatonBrooks 12/16/20 who presents to the emergency department [facility] due to concern for postoperative ileus.  Patient underwent lumbar fusion with both an anterior and posterior approach, she was having some mild abdominal discomfort in the hospital and was not able to have a bowel movement but wished to go home therefore she was discharged yesterday afternoon.  Upon returning home her abdominal discomfort continued, it is generalized, it is without alleviating or aggravating factors.  She had associated nausea and continued constipation.  She was able to pass a very small amount of liquid stool at home yesterday, however given her continued abdominal pain she [facility]  There she had labs and an x-ray performed, there was concern for postoperative ileus, her case was discussed with orthopedics who recommended [facility].  On arrival she continues to have abdominal pain, states that she vomited once at the prior emergency department but has not since.  Has been passing gas but does not know if this is helping her discomfort.  She has some postoperative back pain.  She has no new neurologic symptoms.  She denies fever, chills, hematemesis, melena, hematochezia, or dysuria.  HPI     Past Medical History:  Diagnosis Date   Acquired spondylolisthesis    Anemia    Anxiety    Atherosclerosis    Bipolar depression (HCC)    Type 2   Chronic back pain    Degeneration of lumbar intervertebral disc    Depression     Diabetes mellitus without complication (HCC)    Type 2   Hand swelling    Headache    Hyperlipidemia    Hypertension    Joint pain    Lumbar pain    Numbness and tingling    Osteoarthritis    Osteoarthritis of knee 07/24/2017   Right knee pain    Spondylolisthesis, grade 1    STD (female)    History of    Stroke [facility](HCC)    TIA   Swelling of both lower extremities     Patient Active Problem List   Diagnosis Date Noted   S/P lumbar fusion 12/16/2020    Past Surgical History:  Procedure Laterality Date   ABDOMINAL HYSTERECTOMY  05/16/2015   CARPAL TUNNEL RELEASE  05/15/2009   CATARACT EXTRACTION  05/15/2016   CHOLECYSTECTOMY  05/16/2007   COLONOSCOPY  05/15/2014   CRANIOTOMY Left 2015   HAND SURGERY  05/16/2011   TUBAL LIGATION  05/15/1976     OB History   No obstetric history on file.     Family History  Problem Relation Age of Onset   Arthritis Mother    Stroke Mother    Diabetes Mother    Hypertension Mother    Arthritis Father    Diabetes Father    Heart disease Sister    Arthritis Sister    Diabetes Sister    Hypertension Sister    Hypertension Brother    Heart disease Brother    Diabetes Brother  Social History   Tobacco Use   Smoking status: Never   Smokeless tobacco: Never  Vaping Use   Vaping Use: Never used  Substance Use Topics   Alcohol use: Not Currently   Drug use: Not Currently    Types: Marijuana    Home Medications Prior to Admission medications   Medication Sig Start Date End Date Taking? Authorizing Provider  acetaminophen (TYLENOL) 500 MG tablet Take 1,000 mg by mouth every 8 (eight) hours as needed for moderate pain.    [provider]  acyclovir (ZOVIRAX) 400 MG tablet Take 400 mg by mouth daily.    [provider]  atorvastatin (LIPITOR) 20 MG tablet Take 20 mg by mouth at bedtime. 04/23/17   [provider]  b complex vitamins capsule Take 1 capsule by mouth daily.    [provider]  cholecalciferol (VITAMIN D3) 25 MCG (1000 UNIT) tablet Take 1,000 Units by mouth daily.    [provider]  clonazePAM (KLONOPIN) 1 MG tablet Take 0.5-1 mg by mouth at bedtime as needed for anxiety (sleep).    [provider]  enalapril (VASOTEC) 20 MG tablet Take 20 mg by mouth in the morning and at bedtime.    [provider]  enoxaparin (LOVENOX) 40 MG/0.4ML injection Inject 0.4 mLs (40 mg total) into the skin daily for 10 days. 10 day supply 1 injection per day 12/16/20 12/26/20  Venita Lick, MD  gabapentin (NEURONTIN) 300 MG capsule Take 300 mg by mouth at bedtime.    [provider]  hydrALAZINE (APRESOLINE) 25 MG tablet Take 25 mg by mouth 3 (three) times daily as needed (high blood pressure).    [provider]  hydrochlorothiazide (HYDRODIURIL) 25 MG tablet Take 25 mg by mouth daily. 04/23/17   [provider]  insulin glargine (LANTUS) 100 UNIT/ML Solostar Pen Inject 20 Units into the skin at bedtime.    [provider]  lamoTRIgine (LAMICTAL) 200 MG tablet Take 200 mg by mouth at bedtime.    [provider]  MAGNESIUM CITRATE PO Take 400 mg by mouth in the morning and at bedtime.    [provider]  metFORMIN (GLUCOPHAGE) 1000 MG tablet Take 1,000 mg by mouth 2 (two) times daily with a meal.    [provider]  methocarbamol (ROBAXIN) 500 MG tablet Take 1 tablet (500 mg total) by mouth every 8 (eight) hours as needed for up to 5 days for muscle spasms. 12/16/20 12/21/20  Venita Lick, MD  omeprazole (PRILOSEC) 40 MG capsule Take 40 mg by mouth daily.    [provider]  ondansetron (ZOFRAN) 4 MG tablet Take 1 tablet (4 mg total) by mouth every 8 (eight) hours as needed for nausea or vomiting. 12/16/20   Venita Lick, MD  oxyCODONE-acetaminophen (PERCOCET) 10-325 MG tablet Take 1 tablet by mouth every 6 (six) hours as needed for up to 5 days for pain. 12/16/20 12/21/20  Venita Lick, MD  topiramate (TOPAMAX) 25 MG tablet Take 25 mg by mouth at bedtime.    [provider]    Allergies    Patient has no known allergies.  Review of Systems   Review of Systems  Constitutional:  Negative for chills and fever.  Respiratory:  Negative for shortness of breath.   Cardiovascular:  Negative for chest pain.  Gastrointestinal:  Positive for abdominal pain, constipation, nausea and vomiting. Negative for blood in stool.  Genitourinary:  Negative for dysuria.  Musculoskeletal:  Positive for  back pain.  All other systems reviewed and are negative.  Physical Exam Updated Vital Signs BP (!) 155/62 (BP Location: Right Arm)   Pulse 89   Temp 98.7 F (37.1 C)   Resp 13   SpO2 99%   Physical Exam Vitals and nursing note reviewed.  Constitutional:      General: She is not in acute distress.    Appearance: She is well-developed. She is not toxic-appearing.  HENT:     Head: Normocephalic and atraumatic.  Eyes:     General:        Right eye: No discharge.        Left eye: No discharge.     Conjunctiva/sclera: Conjunctivae normal.  Cardiovascular:     Rate and Rhythm: Normal rate and regular rhythm.  Pulmonary:     Effort: Pulmonary effort is normal. No respiratory distress.     Breath sounds: Normal breath sounds. No wheezing, rhonchi or rales.  Abdominal:     General: There is no distension.     Palpations: Abdomen is soft.     Tenderness: There is generalized abdominal tenderness. There is no guarding or rebound.     Comments: Surgical scar with bandage in place, no significant erythema or purulent drainage.  Musculoskeletal:     Cervical back: Neck supple.  Skin:    General: Skin is warm and dry.     Findings: No rash.  Neurological:     Mental Status: She is alert.     Comments: Clear speech.   Psychiatric:        Behavior: Behavior normal.    ED Results / Procedures / Treatments   Labs (all labs ordered are listed, but only abnormal  results are displayed) Labs Reviewed  HEMOGLOBIN A1C  COMPREHENSIVE METABOLIC PANEL  CK  MAGNESIUM  PHOSPHORUS  CBC WITH DIFFERENTIAL/PLATELET  CREATININE, URINE, RANDOM  SODIUM, URINE, RANDOM  OSMOLALITY, URINE  OSMOLALITY  CBG MONITORING, ED    EKG None  Radiology CT Abdomen Pelvis W Contrast  Result Date: 12/19/2020 CLINICAL DATA:  Abdominal pain and incontinence EXAM: CT ABDOMEN AND PELVIS WITH CONTRAST TECHNIQUE: Multidetector CT imaging of the abdomen and pelvis was performed using the standard protocol following bolus administration of intravenous contrast. CONTRAST:  OMNIPAQUE IOHEXOL 300 MG/ML  SOLN COMPARISON:  None. FINDINGS: Lower chest: Right lower lobe subsegmental atelectasis. Hepatobiliary: No suspicious hepatic lesion. Gallbladder surgically absent. No biliary ductal dilation. There is some pneumoperitoneum tracking along the proximal portal triads without definite portal venous gas. Pancreas: Within normal limits. Spleen: Within normal limits. Adrenals/Urinary Tract: Bilateral adrenal gland nodules measuring 2.1 cm on the left and 1.8 cm on the right. No hydronephrosis. Tiny hypodense bilateral renal lesions which are technically too small to accurately characterize but favored represent cysts. Gas in the urinary bladder which may be related to recent instrumentation. Stomach/Bowel: Stomach is unremarkable for degree of distension. No pathologic dilation of small bowel. Small volume of formed stool in the colon. No evidence of acute small bowel or colonic inflammation. Vascular/Lymphatic: Aortic and branch vessel atherosclerosis without aneurysmal dilation. No pathologically enlarged abdominal or pelvic lymph nodes. Reproductive: Status post hysterectomy. No adnexal masses. Other: There is left anterior abdominal wall subcutaneous gas and free fluid which tracks along the inguinal canal into the left mons and left thigh musculature as well as along the left anterior  abdominal wall into the subcutaneous soft tissues of the lower thorax, likely related to left lateral oblique anterior exposure portion of  the recent lumbar surgery for placement of interbody spacer. There is pneumoperitoneum and pneumo retroperitoneum with small volume of pelvic free fluid and left-sided retroperitoneal free fluid. No. Musculoskeletal: Postsurgical change of L4-L5 posterior spinal fusion and discectomy with edema and subcutaneous gas in the posterior subcutaneous soft tissues. 2.3 x 1.6 x 2.3 cm fluid collection within the area of postsurgical change in the posterior subcutaneous soft tissues on image 49/3 and 59/7. IMPRESSION: 1. No evidence of acute bowel inflammation. 2. Postsurgical change of L4-L5 posterior spinal fusion and discectomy with edema and subcutaneous gas in the posterior subcutaneous soft tissues and a 2.3 x 1.6 x 2.3 cm fluid collection within the area of postsurgical change in the posterior subcutaneous soft tissues, favored to represent a postoperative seroma, however abscess is not excluded. 3. Left anterior abdominal wall edema and subcutaneous gas with pneumoperitoneum and pneumo retroperitoneum with small volume of pelvic free fluid and left-sided retroperitoneal and peritoneal free fluid. Likely reflecting postoperative change from the left lateral oblique anterior exposure portion of the recent lumbar surgery. 4. Bilateral adrenal gland nodules measuring up to 2.1 cm on the left and 1.8 cm on the right which are statistically likely to represent adenomas however further evaluation with nonemergent adrenal protocol CT or MRI is recommended. 5. Gas in the urinary bladder which may be related to recent instrumentation. 6.  Aortic Atherosclerosis (ICD10-I70.0). Electronically Signed   By: Maudry Mayhew MD   On: 12/19/2020 19:52    Procedures Procedures   Medications Ordered in ED Medications - No data to display  ED Course  I have reviewed the triage vital signs and  the nursing notes.  Pertinent labs & imaging results that were available during my care of the patient were reviewed by me and considered in my medical decision making (see chart for details).    MDM Rules/Calculators/A&P                           Patient presents to the ED with complaints of abdominal pain.  Nontoxic, vitals without significant abnormality.   Additional history obtained:  Additional history obtained from chart review & nursing note review.  Chart review from [facility] ED paperwork brought with the patient:  Labs:  COVID: Negative UA: Ketonuria, no UTI.  CBC:  mild leukocytosis @ 11.5, no anemia.  CMP: creatinine WNL, mild AST elevation @ 54. Mild hyponatremia @ 129 and hypochloremia @ 90. Glucose 152.   XR Abdomen series + chest 1view:  IMPRESSION:  No evidence of acute cardiopulmonary disease 2. Prominent bowel gas pattern which appears to be predominantly large bowel with a moderate colonic stool burden.   Received, dilaudid, zofran, morphine & NS prior to transfer.   I discussed patient care with Dr. Lequita Halt & subsequently Nelia Shi PA-C with [facility]- will admit to orthopedics service, no need for repeat labs, I further discussed with PA Theda Belfast- will obtain CT A/P for further assessment as well, orthopedics service will consult hospitalist service for input during admission.   CT A/P as documented above, radiology reading as suspected post operative changes, air present, no bowel perf or obstruction reported. Hospitalist/orthopedics made aware of CT results.   Findings and plan of care discussed with supervising physician Dr. Deretha Emory who has provided guidance & is in agreement.   Portions of this note were generated with Scientist, clinical (histocompatibility and immunogenetics). Dictation errors may occur despite best attempts at proofreading.  Final Clinical Impression(s) / ED Diagnoses  Final diagnoses:  Generalized abdominal pain    Rx / DC Orders ED Discharge  Orders     None        Cherly Anderson, PA-C 12/19/20 2048    Vanetta Mulders, MD 12/24/20 [phone]

## 2020-12-19 NOTE — Consult Note (Addendum)
[facility] Medical Consultation  Sherry Moore SAY:[id number] DOB: [birth year] DOA: 12/19/2020 PCP: [name], No   Requesting physician: Dr. Shon Baton,  Date of consultation: 12/19/20    Reason for consultation: Constipation vs ileus  Impression/Recommendations  Active Problems:   Constipation   Hyponatremia   DM2 (diabetes mellitus, type 2) (HCC)   Benign essential HTN   Type 2 diabetes mellitus with hyperlipidemia (HCC)   Hyperlipemia   Dehydration   Prolonged QT interval     Constipation - CT not showing any evidence of ileus, will order bowel regimen, try to avoid opioids, unfortunately QT prolongation limits a few other options  Hyponatremia -  Improved with IVF rehydration  - order urine electrolytes, Gently rehydrate with normal saline at 100  - discussed with nephrology at this point would not initiate 3% normal saline -Frequent labs Check TSH Hold hydrochlorothiazide   DM2-  - Order Sensitive  SI   - continue home insulin regimen decrease   Lantus 10 units given poor pO intake  -  check TSH and HgA1C  - Hold by mouth medications   HTN - resume hydralazine hold enalapril and watch renal function hold HCTZ due to hyponatremia  HLD - restart home meds  Dehydration will rehydrate, noted elevated CK could be also in the setting of recent major surgery Will repeat in AM after rehydration  Qt prolongation -  monitor on tele unit AM then can repeat ECG and reassess avoid QT prolonging medications, rehydrate correct electrolytes   TRH  followup again tomorrow. Please contact  TR[facility] floor coverage AT night through AMION if we can be of assistance in the meanwhile. Thank you for this consultation.  Chief Complaint:  Chief Complaint  Patient presents with   Abdominal Pain     HPI: Sherry Moore with past hx of HLD, HTN, DM2 had lumber fusion on 12/16/2020 by Dr. Shon Baton  No BM while hospitalized insisted on going home still despite no  normal BM. she have had a few  liquid stools developed abdominal distention and nausea presented back to [facility] Emergency service and had imaging done that showed  Large bowel gas and moderate stool burden WBC 11.5, Na 129 Lactic acid 1.3  Ua unremarkable and negative COVID Post op ileus was suspected and pt was transferred to [facility] ER after discussion with Dr. Shon Baton Pt received 1 L of IV fluids and transferred to [facility] Review of Systems:,   Pertinent positives include:    Constitutional:  No weight loss, night sweats, Fevers, chills, weight loss fatigue  HEENT:  No headaches, Difficulty swallowing,Tooth/dental problems,Sore throat,  No sneezing, itching, ear ache, nasal congestion, post nasal drip,  Cardio-vascular:  No chest pain, Orthopnea, PND, anasarca, dizziness, palpitations.no Bilateral lower extremity swelling  GI:  No heartburn, indigestion, abdominal pain, nausea, vomiting, diarrhea, change in bowel habits, loss of appetite, melena, blood in stool, hematemesis Resp:  No shortness of breath at rest. No dyspnea on exertion,No excess mucus, no productive cough, No non-productive cough, No coughing up of blood.No change in color of mucus.No wheezing. Skin:  no rash or lesions. No jaundice GU:  no dysuria, change in color of urine, no urgency or frequency. No straining to urinate.  No flank pain.  Musculoskeletal:  No joint pain or no joint swelling. No decreased range of motion. No back pain.  Psych:  No change in mood or affect. No depression or anxiety. No memory loss.  Neuro: no localizing neurological complaints, no tingling, no weakness, no  double vision, no gait abnormality, no slurred speech, no confusion   All systems reviewed and apart from HOPI all are negative  Past Medical History:  Diagnosis Date   Acquired spondylolisthesis    Anemia    Anxiety    Atherosclerosis    Bipolar depression (HCC)    Type 2   Chronic back pain    Degeneration of lumbar intervertebral disc    Depression     Diabetes mellitus without complication (HCC)    Type 2   Hand swelling    Headache    Hyperlipidemia    Hypertension    Joint pain    Lumbar pain    Numbness and tingling    Osteoarthritis    Osteoarthritis of knee 07/24/2017   Right knee pain    Spondylolisthesis, grade 1    STD (female)    History of    Stroke [facility](HCC)    TIA   Swelling of both lower extremities    Past Surgical History:  Procedure Laterality Date   ABDOMINAL HYSTERECTOMY  05/16/2015   CARPAL TUNNEL RELEASE  05/15/2009   CATARACT EXTRACTION  05/15/2016   CHOLECYSTECTOMY  05/16/2007   COLONOSCOPY  05/15/2014   CRANIOTOMY Left 2015   HAND SURGERY  05/16/2011   TUBAL LIGATION  05/15/1976   Social History:  reports that she has never smoked. She has never used smokeless tobacco. She reports previous alcohol use. She reports previous drug use. Drug: Marijuana.  No Known Allergies Family History  Problem Relation Age of Onset   Arthritis Mother    Stroke Mother    Diabetes Mother    Hypertension Mother    Arthritis Father    Diabetes Father    Heart disease Sister    Arthritis Sister    Diabetes Sister    Hypertension Sister    Hypertension Brother    Heart disease Brother    Diabetes Brother     Prior to Admission medications   Medication Sig Start Date End Date Taking? Authorizing Provider  acetaminophen (TYLENOL) 500 MG tablet Take 1,000 mg by mouth every 8 (eight) hours as needed for moderate pain.    [provider]  acyclovir (ZOVIRAX) 400 MG tablet Take 400 mg by mouth daily.    [provider]  atorvastatin (LIPITOR) 20 MG tablet Take 20 mg by mouth at bedtime. 04/23/17   [provider]  b complex vitamins capsule Take 1 capsule by mouth daily.    [provider]  cholecalciferol (VITAMIN D3) 25 MCG (1000 UNIT) tablet Take 1,000 Units by mouth daily.    [provider]  clonazePAM (KLONOPIN) 1 MG tablet Take 0.5-1 mg by mouth at bedtime as needed  for anxiety (sleep).    [provider]  enalapril (VASOTEC) 20 MG tablet Take 20 mg by mouth in the morning and at bedtime.    [provider]  enoxaparin (LOVENOX) 40 MG/0.4ML injection Inject 0.4 mLs (40 mg total) into the skin daily for 10 days. 10 day supply 1 injection per day 12/16/20 12/26/20  Venita LickBrooks, Dahari, MD  gabapentin (NEURONTIN) 300 MG capsule Take 300 mg by mouth at bedtime.    [provider]  hydrALAZINE (APRESOLINE) 25 MG tablet Take 25 mg by mouth 3 (three) times daily as needed (high blood pressure).    [provider]  hydrochlorothiazide (HYDRODIURIL) 25 MG tablet Take 25 mg by mouth daily. 04/23/17   [provider]  insulin glargine (LANTUS) 100 UNIT/ML Solostar  Pen Inject 20 Units into the skin at bedtime.    [provider]  lamoTRIgine (LAMICTAL) 200 MG tablet Take 200 mg by mouth at bedtime.    [provider]  MAGNESIUM CITRATE PO Take 400 mg by mouth in the morning and at bedtime.    [provider]  metFORMIN (GLUCOPHAGE) 1000 MG tablet Take 1,000 mg by mouth 2 (two) times daily with a meal.    [provider]  methocarbamol (ROBAXIN) 500 MG tablet Take 1 tablet (500 mg total) by mouth every 8 (eight) hours as needed for up to 5 days for muscle spasms. 12/16/20 12/21/20  Venita Lick, MD  omeprazole (PRILOSEC) 40 MG capsule Take 40 mg by mouth daily.    [provider]  ondansetron (ZOFRAN) 4 MG tablet Take 1 tablet (4 mg total) by mouth every 8 (eight) hours as needed for nausea or vomiting. 12/16/20   Venita Lick, MD  oxyCODONE-acetaminophen (PERCOCET) 10-325 MG tablet Take 1 tablet by mouth every 6 (six) hours as needed for up to 5 days for pain. 12/16/20 12/21/20  Venita Lick, MD  topiramate (TOPAMAX) 25 MG tablet Take 25 mg by mouth at bedtime.    [provider]   Physical Exam: Blood pressure (!) 134/53, pulse 90, temperature 98.7 Moore (37.1 C), resp. rate 16, height  5\' 6"  (1.676 m), weight 83.9 kg, SpO2 96 %. Vitals:   12/19/20 1857 12/19/20 1900  BP:    Pulse: 87 90  Resp: 17 16  Temp:    SpO2: 97% 96%    1. General:  in No Acute distress   Chronically ill   2. Psychological: Alert and  Oriented 3. Head/ENT:   Dry Mucous Membranes                          Head Non traumatic, neck supple                          Normal Dentition 4. SKIN:  decreased Skin turgor,  Skin clean Dry and intact no rash 5. Heart: Regular rate and rhythm no  Murmur, no Rub or gallop 6. Lungs:  Clear to auscultation bilaterally, no wheezes or crackles   7. Abdomen: Soft, diffusely tender, Non distended   obese diminished bowel sounds   8. Lower extremities: no clubbing, cyanosis, or  edema 9. Neurologically Grossly intact, moving all 4 extremities equally   10. MSK: Normal range of motion  Labs on Admission:  Basic Metabolic Panel: Recent Labs  Lab 12/13/20 1319 12/16/20 1459  NA 137  --   K 4.1  --   CL 99  --   CO2 27  --   GLUCOSE 111*  --   BUN 11  --   CREATININE 0.83 0.70  CALCIUM 9.8  --    Liver Function Tests: No results for input(s): AST, ALT, ALKPHOS, BILITOT, PROT, ALBUMIN in the last 168 hours. No results for input(s): LIPASE, AMYLASE in the last 168 hours. No results for input(s): AMMONIA in the last 168 hours. CBC: Recent Labs  Lab 12/13/20 1319 12/16/20 1459 12/19/20 2226  WBC 7.9 12.8* 7.3  NEUTROABS  --   --  5.0  HGB 14.8 12.8 11.5*  HCT 45.9 39.0 34.3*  MCV 89.5 87.1 88.2  PLT 387 304 318   Cardiac Enzymes: No results for input(s): CKTOTAL, CKMB, CKMBINDEX, TROPONINI in the last 168 hours. BNP:  Invalid input(s): POCBNP CBG: Recent Labs  Lab 12/17/20 0609 12/17/20 1158 12/17/20 1551 12/17/20 2138 12/18/20 0615  GLUCAP 114* 154* 199* 186* 178*    Radiological Exams on Admission: CT Abdomen Pelvis W Contrast  Result Date: 12/19/2020 CLINICAL DATA:  Abdominal pain and incontinence EXAM: CT ABDOMEN AND PELVIS WITH  CONTRAST TECHNIQUE: Multidetector CT imaging of the abdomen and pelvis was performed using the standard protocol following bolus administration of intravenous contrast. CONTRAST:  OMNIPAQUE IOHEXOL 300 MG/ML  SOLN COMPARISON:  None. FINDINGS: Lower chest: Right lower lobe subsegmental atelectasis. Hepatobiliary: No suspicious hepatic lesion. Gallbladder surgically absent. No biliary ductal dilation. There is some pneumoperitoneum tracking along the proximal portal triads without definite portal venous gas. Pancreas: Within normal limits. Spleen: Within normal limits. Adrenals/Urinary Tract: Bilateral adrenal gland nodules measuring 2.1 cm on the left and 1.8 cm on the right. No hydronephrosis. Tiny hypodense bilateral renal lesions which are technically too small to accurately characterize but favored represent cysts. Gas in the urinary bladder which may be related to recent instrumentation. Stomach/Bowel: Stomach is unremarkable for degree of distension. No pathologic dilation of small bowel. Small volume of formed stool in the colon. No evidence of acute small bowel or colonic inflammation. Vascular/Lymphatic: Aortic and branch vessel atherosclerosis without aneurysmal dilation. No pathologically enlarged abdominal or pelvic lymph nodes. Reproductive: Status post hysterectomy. No adnexal masses. Other: There is left anterior abdominal wall subcutaneous gas and free fluid which tracks along the inguinal canal into the left mons and left thigh musculature as well as along the left anterior abdominal wall into the subcutaneous soft tissues of the lower thorax, likely related to left lateral oblique anterior exposure portion of the recent lumbar surgery for placement of interbody spacer. There is pneumoperitoneum and pneumo retroperitoneum with small volume of pelvic free fluid and left-sided retroperitoneal free fluid. No. Musculoskeletal: Postsurgical change of L4-L5 posterior spinal fusion and discectomy with  edema and subcutaneous gas in the posterior subcutaneous soft tissues. 2.3 x 1.6 x 2.3 cm fluid collection within the area of postsurgical change in the posterior subcutaneous soft tissues on image 49/3 and 59/7. IMPRESSION: 1. No evidence of acute bowel inflammation. 2. Postsurgical change of L4-L5 posterior spinal fusion and discectomy with edema and subcutaneous gas in the posterior subcutaneous soft tissues and a 2.3 x 1.6 x 2.3 cm fluid collection within the area of postsurgical change in the posterior subcutaneous soft tissues, favored to represent a postoperative seroma, however abscess is not excluded. 3. Left anterior abdominal wall edema and subcutaneous gas with pneumoperitoneum and pneumo retroperitoneum with small volume of pelvic free fluid and left-sided retroperitoneal and peritoneal free fluid. Likely reflecting postoperative change from the left lateral oblique anterior exposure portion of the recent lumbar surgery. 4. Bilateral adrenal gland nodules measuring up to 2.1 cm on the left and 1.8 cm on the right which are statistically likely to represent adenomas however further evaluation with nonemergent adrenal protocol CT or MRI is recommended. 5. Gas in the urinary bladder which may be related to recent instrumentation. 6.  Aortic Atherosclerosis (ICD10-I70.0). Electronically Signed   By: Maudry Mayhew MD   On: 12/19/2020 19:52    EKG: Independently reviewed.   Showing sinus tachycardia QTC 504 Time spent: 70 min  Jordynn Perrier [facility] Pager [phone]  If 7PM-7AM, please contact night-coverage [email or web address] Password [email or web address] 12/19/2020, 11:11 PM

## 2020-12-20 DIAGNOSIS — K5900 Constipation, unspecified: Secondary | ICD-10-CM

## 2020-12-20 LAB — CBC WITH DIFFERENTIAL/PLATELET
Abs Immature Granulocytes: 0.04 10*3/uL (ref 0.00–0.07)
Basophils Absolute: 0.1 10*3/uL (ref 0.0–0.1)
Basophils Relative: 1 %
Eosinophils Absolute: 0.1 10*3/uL (ref 0.0–0.5)
Eosinophils Relative: 1 %
HCT: 34.1 % — ABNORMAL LOW (ref 36.0–46.0)
Hemoglobin: 11.3 g/dL — ABNORMAL LOW (ref 12.0–15.0)
Immature Granulocytes: 1 %
Lymphocytes Relative: 19 %
Lymphs Abs: 1.4 10*3/uL (ref 0.7–4.0)
MCH: 29.3 pg (ref 26.0–34.0)
MCHC: 33.1 g/dL (ref 30.0–36.0)
MCV: 88.3 fL (ref 80.0–100.0)
Monocytes Absolute: 0.9 10*3/uL (ref 0.1–1.0)
Monocytes Relative: 12 %
Neutro Abs: 4.9 10*3/uL (ref 1.7–7.7)
Neutrophils Relative %: 66 %
Platelets: 318 10*3/uL (ref 150–400)
RBC: 3.86 MIL/uL — ABNORMAL LOW (ref 3.87–5.11)
RDW: 13.2 % (ref 11.5–15.5)
WBC: 7.4 10*3/uL (ref 4.0–10.5)
nRBC: 0 % (ref 0.0–0.2)

## 2020-12-20 LAB — CBG MONITORING, ED
Glucose-Capillary: 105 mg/dL — ABNORMAL HIGH (ref 70–99)
Glucose-Capillary: 124 mg/dL — ABNORMAL HIGH (ref 70–99)
Glucose-Capillary: 124 mg/dL — ABNORMAL HIGH (ref 70–99)
Glucose-Capillary: 144 mg/dL — ABNORMAL HIGH (ref 70–99)

## 2020-12-20 LAB — BASIC METABOLIC PANEL
Anion gap: 10 (ref 5–15)
BUN: 8 mg/dL (ref 8–23)
CO2: 24 mmol/L (ref 22–32)
Calcium: 8.6 mg/dL — ABNORMAL LOW (ref 8.9–10.3)
Chloride: 101 mmol/L (ref 98–111)
Creatinine, Ser: 0.56 mg/dL (ref 0.44–1.00)
GFR, Estimated: >60 mL/min (ref >60)
Glucose, Bld: 115 mg/dL — ABNORMAL HIGH (ref 70–99)
Potassium: 3.9 mmol/L (ref 3.5–5.1)
Sodium: 135 mmol/L (ref 135–145)

## 2020-12-20 LAB — PREALBUMIN: Prealbumin: 11.3 mg/dL — ABNORMAL LOW (ref 18–38)

## 2020-12-20 LAB — CK: Total CK: 559 U/L — ABNORMAL HIGH (ref 38–234)

## 2020-12-20 LAB — SARS CORONAVIRUS 2 (TAT 6-24 HRS): SARS Coronavirus 2: NEGATIVE

## 2020-12-20 NOTE — ED Notes (Signed)
Pt has had two BM since enema, states that she feels a lot better after 2nd BM, but feels a little dizzy. Pt is resting in bed now with IVFs going.

## 2020-12-20 NOTE — ED Notes (Signed)
Pt was seen and discharged by Dr. Shon Baton

## 2020-12-20 NOTE — ED Notes (Signed)
Pt states she is feeling much better after BM this am. Pt able to ambulate with no assistance. Spoke with Dr. Shon Baton who states that he will most likely discharge the pt from the ER around 1130 today.

## 2020-12-20 NOTE — Consult Note (Signed)
Medical Consultation   Sherry Moore  SRP:[id number]  DOB: 1946-05-03  DOA: 12/19/2020  PCP: [name], No    Requesting physician: Dr. Shon Baton  Reason for consultation: Constipation   History of Present Illness: Patient seen by Dr. Shon Baton and discharged   Covid vaccination; Review of Systems:  Review of Systems  Constitutional: Negative.   HENT: Negative.    Eyes: Negative.   Respiratory: Negative.    Cardiovascular: Negative.   Gastrointestinal:  Positive for constipation. Negative for abdominal pain, blood in stool, diarrhea, heartburn, melena, nausea and vomiting.  Genitourinary: Negative.   Musculoskeletal: Negative.   Skin: Negative.   Neurological: Negative.   Endo/Heme/Allergies: Negative.   Psychiatric/Behavioral: Negative.      Past Medical History: Past Medical History:  Diagnosis Date   Acquired spondylolisthesis    Anemia    Anxiety    Atherosclerosis    Bipolar depression (HCC)    Type 2   Chronic back pain    Degeneration of lumbar intervertebral disc    Depression    Diabetes mellitus without complication (HCC)    Type 2   Hand swelling    Headache    Hyperlipidemia    Hypertension    Joint pain    Lumbar pain    Numbness and tingling    Osteoarthritis    Osteoarthritis of knee 07/24/2017   Right knee pain    Spondylolisthesis, grade 1    STD (female)    History of    Stroke [facility])    TIA   Swelling of both lower extremities     Past Surgical History: Past Surgical History:  Procedure Laterality Date   ABDOMINAL HYSTERECTOMY  05/16/2015   CARPAL TUNNEL RELEASE  05/15/2009   CATARACT EXTRACTION  05/15/2016   CHOLECYSTECTOMY  05/16/2007   COLONOSCOPY  05/15/2014   CRANIOTOMY Left 2015   HAND SURGERY  05/16/2011   TUBAL LIGATION  05/15/1976     Allergies:  No Known Allergies   Social History:  reports that she has never smoked. She has never used smokeless tobacco. She reports previous alcohol use. She reports  previous drug use. Drug: Marijuana.   Family History: Family History  Problem Relation Age of Onset   Arthritis Mother    Stroke Mother    Diabetes Mother    Hypertension Mother    Arthritis Father    Diabetes Father    Heart disease Sister    Arthritis Sister    Diabetes Sister    Hypertension Sister    Hypertension Brother    Heart disease Brother    Diabetes Brother     Continuous Infusions:  sodium chloride 100 mL/hr at 12/19/20 2221     Physical Exam: Vitals:   12/20/20 0400 12/20/20 0500 12/20/20 0600 12/20/20 0745  BP: 138/61 (!) 155/70 (!) 150/68 (!) 151/91  Pulse: 92 89 84 86  Resp:  17 19 (!) 26  Temp:   98.7 F (37.1 C) 98.6 F (37 C)  TempSrc:   Oral   SpO2: 100% 93% 95% 98%  Weight:      Height:       General: No acute respiratory distress Eyes: negative scleral hemorrhage, negative anisocoria, negative icterus ENT: Negative Runny nose, negative gingival bleeding, Neck:  Negative scars, masses, torticollis, lymphadenopathy, JVD Lungs: Clear to auscultation bilaterally without wheezes or crackles Cardiovascular: Regular rate and rhythm without murmur gallop or rub normal  S1 and S2 Abdomen: negative abdominal pain, nondistended, positive soft, bowel sounds, no rebound, no ascites, no appreciable mass Extremities: No significant cyanosis, clubbing, or edema bilateral lower extremities Skin: Negative rashes, lesions, ulcers Psychiatric:  Negative depression, negative anxiety, negative fatigue, negative mania  Central nervous system:  Cranial nerves II through XII intact, tongue/uvula midline, all extremities muscle strength 5/5, sensation intact throughout, negative dysarthria, negative expressive aphasia, negative receptive aphasia.  Data reviewed:  I have personally reviewed following labs and imaging studies Labs:  CBC: Recent Labs  Lab 12/13/20 1319 12/16/20 1459 12/19/20 2226 12/20/20 0414  WBC 7.9 12.8* 7.3 7.4  NEUTROABS  --   --  5.0 4.9   HGB 14.8 12.8 11.5* 11.3*  HCT 45.9 39.0 34.3* 34.1*  MCV 89.5 87.1 88.2 88.3  PLT 387 304 318 318    Basic Metabolic Panel: Recent Labs  Lab 12/13/20 1319 12/16/20 1459 12/19/20 2226 12/20/20 0414  NA 137  --  135 135  K 4.1  --  4.0 3.9  CL 99  --  100 101  CO2 27  --  26 24  GLUCOSE 111*  --  120* 115*  BUN 11  --  8 8  CREATININE 0.83 0.70 0.60 0.56  CALCIUM 9.8  --  8.6* 8.6*  MG  --   --  1.9  --   PHOS  --   --  2.7  --    GFR Estimated Creatinine Clearance: 68.3 mL/min (by C-G formula based on SCr of 0.56 mg/dL). Liver Function Tests: Recent Labs  Lab 12/19/20 2226  AST 39  ALT 31  ALKPHOS 35*  BILITOT 1.0  PROT 5.6*  ALBUMIN 3.1*   No results for input(s): LIPASE, AMYLASE in the last 168 hours. No results for input(s): AMMONIA in the last 168 hours. Coagulation profile Recent Labs  Lab 12/13/20 1319  INR 1.0    Cardiac Enzymes: Recent Labs  Lab 12/19/20 2226 12/20/20 0414  CKTOTAL 704* 559*   BNP: Invalid input(s): POCBNP CBG: Recent Labs  Lab 12/18/20 0615 12/19/20 2025 12/20/20 0014 12/20/20 0400 12/20/20 0741  GLUCAP 178* 91 124* 105* 124*   D-Dimer No results for input(s): DDIMER in the last 72 hours. Hgb A1c Recent Labs    12/19/20 2226  HGBA1C 6.8*   Lipid Profile No results for input(s): CHOL, HDL, LDLCALC, TRIG, CHOLHDL, LDLDIRECT in the last 72 hours. Thyroid function studies No results for input(s): TSH, T4TOTAL, T3FREE, THYROIDAB in the last 72 hours.  Invalid input(s): FREET3 Anemia work up No results for input(s): VITAMINB12, FOLATE, FERRITIN, TIBC, IRON, RETICCTPCT in the last 72 hours. Urinalysis    Component Value Date/Time   COLORURINE YELLOW 12/13/2020 1319   APPEARANCEUR CLEAR 12/13/2020 1319   LABSPEC 1.010 12/13/2020 1319   PHURINE 5.5 12/13/2020 1319   GLUCOSEU NEGATIVE 12/13/2020 1319   HGBUR NEGATIVE 12/13/2020 1319   BILIRUBINUR NEGATIVE 12/13/2020 1319   KETONESUR NEGATIVE 12/13/2020 1319    PROTEINUR NEGATIVE 12/13/2020 1319   NITRITE NEGATIVE 12/13/2020 1319   LEUKOCYTESUR NEGATIVE 12/13/2020 1319     Microbiology Recent Results (from the past 240 hour(s))  Surgical pcr screen     Status: Abnormal   Collection Time: 12/13/20  1:18 PM   Specimen: Nasal Mucosa; Nasal Swab  Result Value Ref Range Status   MRSA, PCR POSITIVE (A) NEGATIVE Final    Comment: RESULT CALLED TO, READ BACK BY AND VERIFIED WITH: RN L HUDSON [phone] AT 713 AM BY CM  Staphylococcus aureus POSITIVE (A) NEGATIVE Final    Comment: (NOTE) The Xpert SA Assay (FDA approved for NASAL specimens in patients 13 years of age and older), is one component of a comprehensive surveillance program. It is not intended to diagnose infection nor to guide or monitor treatment. Performed at [facility], [address] [place], [place] [address]   SARS CORONAVIRUS 2 (TAT 6-24 HRS) Nasopharyngeal Nasopharyngeal Swab     Status: None   Collection Time: 12/19/20 10:07 PM   Specimen: Nasopharyngeal Swab  Result Value Ref Range Status   SARS Coronavirus 2 NEGATIVE NEGATIVE Final    Comment: (NOTE) SARS-CoV-2 target nucleic acids are NOT DETECTED.  The SARS-CoV-2 RNA is generally detectable in upper and lower respiratory specimens during the acute phase of infection. Negative results do not preclude SARS-CoV-2 infection, do not rule out co-infections with other pathogens, and should not be used as the sole basis for treatment or other patient management decisions. Negative results must be combined with clinical observations, patient history, and epidemiological information. The expected result is Negative.  Fact Sheet for Patients: HairSlick.no  Fact Sheet for Healthcare Providers: quierodirigir.com  This test is not yet approved or cleared by the [place] FDA and  has been authorized for detection and/or diagnosis of SARS-CoV-2 by FDA under  an Emergency Use Authorization (EUA). This EUA will remain  in effect (meaning this test can be used) for the duration of the COVID-19 declaration under Se ction 564(b)(1) of the Act, 21 U.S.C. section 360bbb-3(b)(1), unless the authorization is terminated or revoked sooner.  Performed at [facility], [address], [place], [place] [address]        Inpatient Medications:   Scheduled Meds:  docusate sodium  100 mg Oral Daily   enoxaparin (LOVENOX) injection  40 mg Subcutaneous QHS   insulin aspart  0-9 Units Subcutaneous Q4H   polyethylene glycol  17 g Oral BID   Continuous Infusions:  sodium chloride 100 mL/hr at 12/19/20 2221     Radiological Exams on Admission: CT Abdomen Pelvis W Contrast  Result Date: 12/19/2020 CLINICAL DATA:  Abdominal pain and incontinence EXAM: CT ABDOMEN AND PELVIS WITH CONTRAST TECHNIQUE: Multidetector CT imaging of the abdomen and pelvis was performed using the standard protocol following bolus administration of intravenous contrast. CONTRAST:  OMNIPAQUE IOHEXOL 300 MG/ML  SOLN COMPARISON:  None. FINDINGS: Lower chest: Right lower lobe subsegmental atelectasis. Hepatobiliary: No suspicious hepatic lesion. Gallbladder surgically absent. No biliary ductal dilation. There is some pneumoperitoneum tracking along the proximal portal triads without definite portal venous gas. Pancreas: Within normal limits. Spleen: Within normal limits. Adrenals/Urinary Tract: Bilateral adrenal gland nodules measuring 2.1 cm on the left and 1.8 cm on the right. No hydronephrosis. Tiny hypodense bilateral renal lesions which are technically too small to accurately characterize but favored represent cysts. Gas in the urinary bladder which may be related to recent instrumentation. Stomach/Bowel: Stomach is unremarkable for degree of distension. No pathologic dilation of small bowel. Small volume of formed stool in the colon. No evidence of acute small bowel or colonic  inflammation. Vascular/Lymphatic: Aortic and branch vessel atherosclerosis without aneurysmal dilation. No pathologically enlarged abdominal or pelvic lymph nodes. Reproductive: Status post hysterectomy. No adnexal masses. Other: There is left anterior abdominal wall subcutaneous gas and free fluid which tracks along the inguinal canal into the left mons and left thigh musculature as well as along the left anterior abdominal wall into the subcutaneous soft tissues of the  lower thorax, likely related to left lateral oblique anterior exposure portion of the recent lumbar surgery for placement of interbody spacer. There is pneumoperitoneum and pneumo retroperitoneum with small volume of pelvic free fluid and left-sided retroperitoneal free fluid. No. Musculoskeletal: Postsurgical change of L4-L5 posterior spinal fusion and discectomy with edema and subcutaneous gas in the posterior subcutaneous soft tissues. 2.3 x 1.6 x 2.3 cm fluid collection within the area of postsurgical change in the posterior subcutaneous soft tissues on image 49/3 and 59/7. IMPRESSION: 1. No evidence of acute bowel inflammation. 2. Postsurgical change of L4-L5 posterior spinal fusion and discectomy with edema and subcutaneous gas in the posterior subcutaneous soft tissues and a 2.3 x 1.6 x 2.3 cm fluid collection within the area of postsurgical change in the posterior subcutaneous soft tissues, favored to represent a postoperative seroma, however abscess is not excluded. 3. Left anterior abdominal wall edema and subcutaneous gas with pneumoperitoneum and pneumo retroperitoneum with small volume of pelvic free fluid and left-sided retroperitoneal and peritoneal free fluid. Likely reflecting postoperative change from the left lateral oblique anterior exposure portion of the recent lumbar surgery. 4. Bilateral adrenal gland nodules measuring up to 2.1 cm on the left and 1.8 cm on the right which are statistically likely to represent adenomas  however further evaluation with nonemergent adrenal protocol CT or MRI is recommended. 5. Gas in the urinary bladder which may be related to recent instrumentation. 6.  Aortic Atherosclerosis (ICD10-I70.0). Electronically Signed   By: Maudry Mayhew MD   On: 12/19/2020 19:52    Impression/Recommendations Active Problems:   Constipation   Hyponatremia   DM2 (diabetes mellitus, type 2) (HCC)   Benign essential HTN   Type 2 diabetes mellitus with hyperlipidemia (HCC)   Hyperlipemia   Dehydration   Prolonged QT interval   Elevated CK  Constipation - CT not showing any evidence of ileus, will order bowel regimen, try to avoid opioids, unfortunately QT prolongation limits a few other options -8/8 Dr. Shon Baton evaluated patient and discharged from the ED.   Hyponatremia - Improved with IVF rehydration  - order urine electrolytes, Gently rehydrate with normal saline at 100 - discussed with nephrology at this point would not initiate 3% normal saline -Frequent labs Check TSH Hold hydrochlorothiazide    DM type II controlled without complication -8/7 hemoglobin A1c 6.8  - Order Sensitive  SI  - continue home insulin regimen decrease   Lantus 10 units given poor pO intake  -  check TSH and HgA1C  - Hold by mouth medications   HTN - resume hydralazine hold enalapril and watch renal function hold HCTZ due to hyponatremia   HLD - restart home meds   Dehydration will rehydrate, noted elevated CK could be also in the setting of recent major surgery Will repeat in AM after rehydration   Qt prolongation -  monitor on tele unit AM then can repeat ECG and reassess avoid QT prolonging medications, rehydrate correct electrolytes   Thank you for this consultation.  Our [facility] hospitalist team will sign off   Time Spent: 0 minutes  Yekaterina Escutia, Roselind Messier M.D. [facility] 12/20/2020, 8:57 AM  [phone]

## 2020-12-20 NOTE — Progress Notes (Signed)
    Subjective:     Patient reports pain as 1 on 0-10 scale.  Reports decreased leg pain reports incisional back pain   Positive void Positive bowel movement Positive flatus Negative chest pain or shortness of breath  Objective: Vital signs in last 24 hours: Temp:  [98.6 F (37 C)-98.7 F (37.1 C)] 98.6 F (37 C) (08/08 0745) Pulse Rate:  [73-100] 73 (08/08 1100) Resp:  [13-26] 17 (08/08 1100) BP: (116-165)/(53-96) 148/85 (08/08 1100) SpO2:  [90 %-100 %] 99 % (08/08 1100) Weight:  [83.9 kg] 83.9 kg (08/07 1706)  Intake/Output from previous day: 08/07 0701 - 08/08 0700 In: -  Out: 500 [Urine:500]  Labs: Recent Labs    12/19/20 2226 12/20/20 0414  WBC 7.3 7.4  RBC 3.89 3.86*  HCT 34.3* 34.1*  PLT 318 318   Recent Labs    12/19/20 2226 12/20/20 0414  NA 135 135  K 4.0 3.9  CL 100 101  CO2 26 24  BUN 8 8  CREATININE 0.60 0.56  GLUCOSE 120* 115*  CALCIUM 8.6* 8.6*   No results for input(s): LABPT, INR in the last 72 hours.  Physical Exam: Neurologically intact ABD soft Intact pulses distally Incision: dressing C/D/I Compartment soft Body mass index is 29.86 kg/m.  CT scan of the abdomen:   IMPRESSION: 1. No evidence of acute bowel inflammation. 2. Postsurgical change of L4-L5 posterior spinal fusion and discectomy with edema and subcutaneous gas in the posterior subcutaneous soft tissues and a 2.3 x 1.6 x 2.3 cm fluid collection within the area of postsurgical change in the posterior subcutaneous soft tissues, favored to represent a postoperative seroma, however abscess is not excluded. 3. Left anterior abdominal wall edema and subcutaneous gas with pneumoperitoneum and pneumo retroperitoneum with small volume of pelvic free fluid and left-sided retroperitoneal and peritoneal free fluid. Likely reflecting postoperative change from the left lateral oblique anterior exposure portion of the recent lumbar surgery. 4. Bilateral adrenal gland nodules  measuring up to 2.1 cm on the left and 1.8 cm on the right which are statistically likely to represent adenomas however further evaluation with nonemergent adrenal protocol CT or MRI is recommended. 5. Gas in the urinary bladder which may be related to recent instrumentation. 6.  Aortic Atherosclerosis (ICD10-I70.0).  Assessment/Plan: Patient stable  CT scan demonstrates no ileus.  Postoperative changes related to her L4-5 fusion is noted. Continue mobilization with physical therapy Continue care  Patient's ileus is resolved.  She has had a large bowel movement and she is voiding spontaneously.  She has no normal rectal tone and no perianal dysesthesias.  We will discharge her to home and she will follow-up as scheduled in 2 weeks.  Venita Lick, MD [facility] [phone]

## 2020-12-22 LAB — SARS CORONAVIRUS 2 (TAT 6-24 HRS): SARS Coronavirus 2: NEGATIVE

## 2020-12-24 ENCOUNTER — Encounter (HOSPITAL_COMMUNITY): Payer: Self-pay | Admitting: Orthopedic Surgery

## 2020-12-28 ENCOUNTER — Encounter (HOSPITAL_COMMUNITY): Payer: Self-pay | Admitting: Orthopedic Surgery

## 2020-12-28 NOTE — Discharge Summary (Signed)
Patient ID: Sherry Folkatrice E Hackleman MRN: [id number] DOB/AGE: 74/05/1945 74 y.o.  Admit date: 12/16/2020 Discharge date: 12/18/2020  Admission Diagnoses:  Active Problems:   S/P lumbar fusion   Discharge Diagnoses:  Active Problems:   S/P lumbar fusion  status post Procedure(s): OBLIQUE LUMBAR INTERBODY FUSION 1 LEVEL L4-5 WITH POSTERIOR SPINAL FUSION INTERBODY L4-5 ABDOMINAL EXPOSURE  Past Medical History:  Diagnosis Date   Acquired spondylolisthesis    Anemia    Anxiety    Atherosclerosis    Bipolar depression (HCC)    Type 2   Chronic back pain    Degeneration of lumbar intervertebral disc    Depression    Diabetes mellitus without complication (HCC)    Type 2   Hand swelling    Headache    Hyperlipidemia    Hypertension    Joint pain    Lumbar pain    Numbness and tingling    Osteoarthritis    Osteoarthritis of knee 07/24/2017   Right knee pain    Spondylolisthesis, grade 1    STD (female)    History of    Stroke (HCC)    TIA   Swelling of both lower extremities     Surgeries: Procedure(s): OBLIQUE LUMBAR INTERBODY FUSION 1 LEVEL L4-5 WITH POSTERIOR SPINAL FUSION INTERBODY L4-5 ABDOMINAL EXPOSURE on 12/16/2020   Consultants: Treatment Team:  Cephus Shellinglark, Christopher J, MD  Discharged Condition: Improved  Hospital Course: Sherry Moore is an 75 y.o. 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 Patient failed conservative treatments (please see the history and physical for the specifics) and had severe unremitting pain that affects sleep, daily activities and work/hobbies. After pre-op clearance, the patient was taken to the operating room on 12/16/2020 and underwent  Procedure(s): OBLIQUE LUMBAR INTERBODY FUSION 1 LEVEL L4-5 WITH POSTERIOR SPINAL FUSION INTERBODY L4-5 ABDOMINAL EXPOSURE.    Patient was given perioperative antibiotics:  Anti-infectives (From admission, onward)    Start     Dose/Rate Route Frequency  Ordered Stop   12/16/20 1800  ceFAZolin (ANCEF) IVPB 1 g/50 mL premix        1 g 100 mL/hr over 30 Minutes Intravenous  Once 12/16/20 1404 12/16/20 1755   12/16/20 0715  vancomycin (VANCOCIN) IVPB 1000 mg/200 mL premix        1,000 mg 200 mL/hr over 60 Minutes Intravenous  Once 12/16/20 0711 12/16/20 0815   12/16/20 0713  vancomycin (VANCOCIN) 1-5 GM/200ML-% IVPB       Note to Pharmacy: Micki Cassel Peaavis, Natalie   : cabinet override      12/16/20 0713 12/16/20 1929   12/16/20 0548  ceFAZolin (ANCEF) IVPB 2g/100 mL premix  Status:  Discontinued        2 g 200 mL/hr over 30 Minutes Intravenous 30 min pre-op 12/16/20 0548 12/16/20 1353        Patient was given sequential compression devices and early ambulation to prevent DVT.   Patient benefited maximally from hospital stay and there were no complications. At the time of discharge, the patient was urinating/passing gas, tolerating a regular diet, pain is controlled with oral pain medications and they have been cleared by PT/OT.   Recent vital signs: No data found.   Recent laboratory studies: No results for input(s): WBC, HGB, HCT, PLT, NA, K, CL, CO2, BUN, CREATININE, GLUCOSE, INR, CALCIUM in the last 72 hours.  Invalid input(s): PT, 2   Discharge Medications:   Allergies as of 12/18/2020   No Known  Allergies      Medication List     STOP taking these medications    aspirin 325 MG EC tablet   multivitamin with minerals tablet   naproxen sodium 220 MG tablet Commonly known as: ALEVE       TAKE these medications    acetaminophen 500 MG tablet Commonly known as: TYLENOL Take 1,000 mg by mouth every 8 (eight) hours as needed for moderate pain.   acyclovir 400 MG tablet Commonly known as: ZOVIRAX Take 400 mg by mouth daily.   atorvastatin 20 MG tablet Commonly known as: LIPITOR Take 20 mg by mouth at bedtime.   clonazePAM 1 MG tablet Commonly known as: KLONOPIN Take 0.5-1 mg by mouth at bedtime as needed for anxiety  (sleep).   enalapril 20 MG tablet Commonly known as: VASOTEC Take 20 mg by mouth 2 (two) times daily.   enoxaparin 40 MG/0.4ML injection Commonly known as: LOVENOX Inject 0.4 mLs (40 mg total) into the skin daily for 10 days. 10 day supply 1 injection per day   gabapentin 300 MG capsule Commonly known as: NEURONTIN Take 300 mg by mouth at bedtime.   hydrALAZINE 25 MG tablet Commonly known as: APRESOLINE Take 25 mg by mouth See admin instructions. Take one tablet (25 mg) by mouth twice daily, may also take one tablet (25 mg) midday as needed for high blood pressure (SBP >155 as long as DBP is >50)   hydrochlorothiazide 25 MG tablet Commonly known as: HYDRODIURIL Take 25 mg by mouth daily.   insulin glargine 100 UNIT/ML Solostar Pen Commonly known as: LANTUS Inject 20 Units into the skin at bedtime.   lamoTRIgine 200 MG tablet Commonly known as: LAMICTAL Take 200 mg by mouth at bedtime.   metFORMIN 1000 MG tablet Commonly known as: GLUCOPHAGE Take 1,000 mg by mouth 2 (two) times daily with a meal.   omeprazole 40 MG capsule Commonly known as: PRILOSEC Take 40 mg by mouth daily.   ondansetron 4 MG tablet Commonly known as: Zofran Take 1 tablet (4 mg total) by mouth every 8 (eight) hours as needed for nausea or vomiting.   topiramate 25 MG tablet Commonly known as: TOPAMAX Take 25 mg by mouth at bedtime.       ASK your doctor about these medications    methocarbamol 500 MG tablet Commonly known as: Robaxin Take 1 tablet (500 mg total) by mouth every 8 (eight) hours as needed for up to 5 days for muscle spasms. Ask about: Should I take this medication?   oxyCODONE-acetaminophen 10-325 MG tablet Commonly known as: Percocet Take 1 tablet by mouth every 6 (six) hours as needed for up to 5 days for pain. Ask about: Should I take this medication?        Diagnostic Studies: DG Chest 2 View  Result Date: 12/13/2020 CLINICAL DATA:  Preoperative assessment for  lower back surgery, hypertension, diabetes EXAM: CHEST - 2 VIEW COMPARISON:  None. FINDINGS: The heart size and mediastinal contours are within normal limits. Both lungs are clear. The visualized skeletal structures are unremarkable. IMPRESSION: No active cardiopulmonary disease. Electronically Signed   By: Sharlet Salina M.D.   On: 12/13/2020 23:59   DG Lumbar Spine 2-3 Views  Result Date: 12/16/2020 CLINICAL DATA:  Elective surgery Z41.9 (ICD-10-CM). Additional history provided by technologist: L4-5 oblique lumbar interbody fusion with L4-5 posterior spinal fusion interbody for L4-5 degenerative spondylosis. Provided fluoroscopy time 4 minutes, 36 seconds (183.77 mGy). EXAM: LUMBAR SPINE - 2-3 VIEW; DG  C-ARM 1-60 MIN COMPARISON:  Intraoperative radiographs of the abdomen performed earlier today 12/16/2020. FINDINGS: AP and lateral view intraoperative fluoroscopic images of the lumbar spine are submitted, 2 images total. The lowest well-formed intervertebral disc space is designated L5-S1. On the provided images, a posterior spinal fusion construct is present at L4-L5 (bilateral pedicle screws and vertical interconnecting rods). Interbody device(s) also present at L4-L5. Trace L4-L5 grade 1 anterolisthesis. IMPRESSION: Two intraoperative fluoroscopic images of the lumbar spine from L4-L5 posterior fusion, as described. Electronically Signed   By: Jackey Loge DO   On: 12/16/2020 13:36   CT Abdomen Pelvis W Contrast  Result Date: 12/19/2020 CLINICAL DATA:  Abdominal pain and incontinence EXAM: CT ABDOMEN AND PELVIS WITH CONTRAST TECHNIQUE: Multidetector CT imaging of the abdomen and pelvis was performed using the standard protocol following bolus administration of intravenous contrast. CONTRAST:  OMNIPAQUE IOHEXOL 300 MG/ML  SOLN COMPARISON:  None. FINDINGS: Lower chest: Right lower lobe subsegmental atelectasis. Hepatobiliary: No suspicious hepatic lesion. Gallbladder surgically absent. No biliary  ductal dilation. There is some pneumoperitoneum tracking along the proximal portal triads without definite portal venous gas. Pancreas: Within normal limits. Spleen: Within normal limits. Adrenals/Urinary Tract: Bilateral adrenal gland nodules measuring 2.1 cm on the left and 1.8 cm on the right. No hydronephrosis. Tiny hypodense bilateral renal lesions which are technically too small to accurately characterize but favored represent cysts. Gas in the urinary bladder which may be related to recent instrumentation. Stomach/Bowel: Stomach is unremarkable for degree of distension. No pathologic dilation of small bowel. Small volume of formed stool in the colon. No evidence of acute small bowel or colonic inflammation. Vascular/Lymphatic: Aortic and branch vessel atherosclerosis without aneurysmal dilation. No pathologically enlarged abdominal or pelvic lymph nodes. Reproductive: Status post hysterectomy. No adnexal masses. Other: There is left anterior abdominal wall subcutaneous gas and free fluid which tracks along the inguinal canal into the left mons and left thigh musculature as well as along the left anterior abdominal wall into the subcutaneous soft tissues of the lower thorax, likely related to left lateral oblique anterior exposure portion of the recent lumbar surgery for placement of interbody spacer. There is pneumoperitoneum and pneumo retroperitoneum with small volume of pelvic free fluid and left-sided retroperitoneal free fluid. No. Musculoskeletal: Postsurgical change of L4-L5 posterior spinal fusion and discectomy with edema and subcutaneous gas in the posterior subcutaneous soft tissues. 2.3 x 1.6 x 2.3 cm fluid collection within the area of postsurgical change in the posterior subcutaneous soft tissues on image 49/3 and 59/7. IMPRESSION: 1. No evidence of acute bowel inflammation. 2. Postsurgical change of L4-L5 posterior spinal fusion and discectomy with edema and subcutaneous gas in the posterior  subcutaneous soft tissues and a 2.3 x 1.6 x 2.3 cm fluid collection within the area of postsurgical change in the posterior subcutaneous soft tissues, favored to represent a postoperative seroma, however abscess is not excluded. 3. Left anterior abdominal wall edema and subcutaneous gas with pneumoperitoneum and pneumo retroperitoneum with small volume of pelvic free fluid and left-sided retroperitoneal and peritoneal free fluid. Likely reflecting postoperative change from the left lateral oblique anterior exposure portion of the recent lumbar surgery. 4. Bilateral adrenal gland nodules measuring up to 2.1 cm on the left and 1.8 cm on the right which are statistically likely to represent adenomas however further evaluation with nonemergent adrenal protocol CT or MRI is recommended. 5. Gas in the urinary bladder which may be related to recent instrumentation. 6.  Aortic Atherosclerosis (ICD10-I70.0). Electronically Signed  By: Maudry Mayhew MD   On: 12/19/2020 19:52   DG C-Arm 1-60 Min  Result Date: 12/16/2020 CLINICAL DATA:  Elective surgery Z41.9 (ICD-10-CM). Additional history provided by technologist: L4-5 oblique lumbar interbody fusion with L4-5 posterior spinal fusion interbody for L4-5 degenerative spondylosis. Provided fluoroscopy time 4 minutes, 36 seconds (183.77 mGy). EXAM: LUMBAR SPINE - 2-3 VIEW; DG C-ARM 1-60 MIN COMPARISON:  Intraoperative radiographs of the abdomen performed earlier today 12/16/2020. FINDINGS: AP and lateral view intraoperative fluoroscopic images of the lumbar spine are submitted, 2 images total. The lowest well-formed intervertebral disc space is designated L5-S1. On the provided images, a posterior spinal fusion construct is present at L4-L5 (bilateral pedicle screws and vertical interconnecting rods). Interbody device(s) also present at L4-L5. Trace L4-L5 grade 1 anterolisthesis. IMPRESSION: Two intraoperative fluoroscopic images of the lumbar spine from L4-L5 posterior  fusion, as described. Electronically Signed   By: Jackey Loge DO   On: 12/16/2020 13:36   VAS [place] LOWER EXTREMITY VENOUS (DVT)  Result Date: 12/17/2020  Lower Venous DVT Study Patient Name:  KETZALY CARDELLA  Date of Exam:   12/17/2020 Medical Rec #: [id number]        Accession #:    [id number] Date of Birth: 22-Dec-1945        Patient Gender: F Patient Age:   54 years Exam Location:  [facility] Procedure:      VAS [place] LOWER EXTREMITY VENOUS (DVT) Referring Phys: Marchelle Folks Oleta [place] --------------------------------------------------------------------------------  Indications: Edema, and Post-op.  Comparison Study: No prior study Performing Technologist: Gertie Fey MHA, RDMS, RVT, RDCS  Examination Guidelines: A complete evaluation includes B-mode imaging, spectral Doppler, color Doppler, and power Doppler as needed of all accessible portions of each vessel. Bilateral testing is considered an integral part of a complete examination. Limited examinations for reoccurring indications may be performed as noted. The reflux portion of the exam is performed with the patient in reverse Trendelenburg.  +---------+---------------+---------+-----------+----------+--------------+ RIGHT    CompressibilityPhasicitySpontaneityPropertiesThrombus Aging +---------+---------------+---------+-----------+----------+--------------+ CFV      Full           Yes      Yes                                 +---------+---------------+---------+-----------+----------+--------------+ SFJ      Full                                                        +---------+---------------+---------+-----------+----------+--------------+ FV Prox  Full                                                        +---------+---------------+---------+-----------+----------+--------------+ FV Mid   Full                                                         +---------+---------------+---------+-----------+----------+--------------+ FV DistalFull                                                        +---------+---------------+---------+-----------+----------+--------------+  PFV      Full                                                        +---------+---------------+---------+-----------+----------+--------------+ POP      Full           Yes      Yes                                 +---------+---------------+---------+-----------+----------+--------------+ PTV      Full                                                        +---------+---------------+---------+-----------+----------+--------------+ PERO     Full                                                        +---------+---------------+---------+-----------+----------+--------------+   +---------+---------------+---------+-----------+----------+--------------+ LEFT     CompressibilityPhasicitySpontaneityPropertiesThrombus Aging +---------+---------------+---------+-----------+----------+--------------+ CFV      Full           Yes      Yes                                 +---------+---------------+---------+-----------+----------+--------------+ SFJ      Full                                                        +---------+---------------+---------+-----------+----------+--------------+ FV Prox  Full                                                        +---------+---------------+---------+-----------+----------+--------------+ FV Mid   Full                                                        +---------+---------------+---------+-----------+----------+--------------+ FV DistalFull                                                        +---------+---------------+---------+-----------+----------+--------------+ PFV      Full                                                         +---------+---------------+---------+-----------+----------+--------------+  POP      Full           Yes      Yes                                 +---------+---------------+---------+-----------+----------+--------------+ PTV      Full                                                        +---------+---------------+---------+-----------+----------+--------------+ PERO     Full                                                        +---------+---------------+---------+-----------+----------+--------------+     Summary: RIGHT: - There is no evidence of deep vein thrombosis in the lower extremity.  - No cystic structure found in the popliteal fossa.  LEFT: - There is no evidence of deep vein thrombosis in the lower extremity.  - No cystic structure found in the popliteal fossa.  *See table(s) above for measurements and observations. Electronically signed by Heath Lark on 12/17/2020 at 5:30:46 PM.    Final    DG OR LOCAL ABDOMEN  Result Date: 12/16/2020 CLINICAL DATA:  Lumbar surgery. Evaluation for retained instruments. EXAM: OR LOCAL ABDOMEN COMPARISON:  No prior. FINDINGS: Surgical staples noted over the abdomen and pelvis. Prior interbody lumbar fusion. No retained instruments. Degenerative change thoracic spine. IMPRESSION: Surgical staples noted over the abdomen pelvis. Prior interbody lumbar fusion. No retained instruments. Report phoned to the OR at the time of dictation. Electronically Signed   By: Maisie Fus  Register   On: 12/16/2020 10:41    Discharge Instructions     Incentive spirometry RT   Complete by: As directed         Follow-up Information     Venita Lick, MD Follow up in 2 week(s).   Specialty: Orthopedic Surgery Why: If symptoms worsen, For suture removal, For wound re-check Contact information: [address] [place] [address] [phone]                 Discharge Plan:  discharge to home  Disposition:  stable    Signed: Rhodia Albright for [facility] PA-C [facility] [phone] 12/28/2020, 8:28 AM

## 2023-01-04 IMAGING — CT CT ABD-PELV W/ CM
2 of 5 series · 14 of 46 positions shown, 16 images · IV contrast (APPLIED)
Comparison: None.

CLINICAL DATA: Abdominal pain and incontinence

EXAM:
CT ABDOMEN AND PELVIS WITH CONTRAST
TECHNIQUE: Multidetector CT imaging of the abdomen and pelvis was performed
using the standard protocol following bolus administration of
intravenous contrast.
CONTRAST:  100mL OMNIPAQUE IOHEXOL 300 MG/ML  SOLN

[Series 3: abd/ pelvis 5.0 i30f 2 · axial · 0.90mm/px · z∈[-72,+374]mm · 11 of 101 slices shown, 13 images]
[im 6/101  soft-tissue]
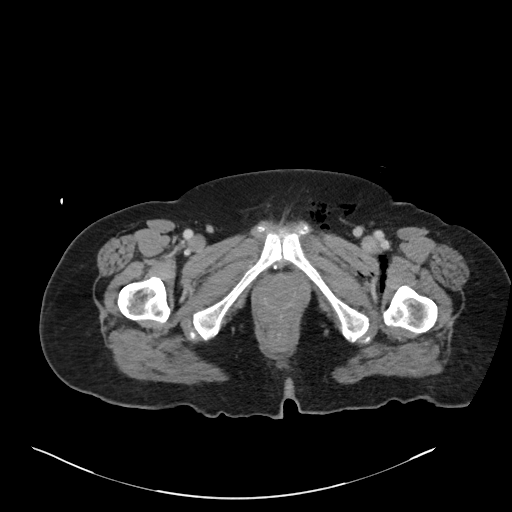
[im 6/101  bone]
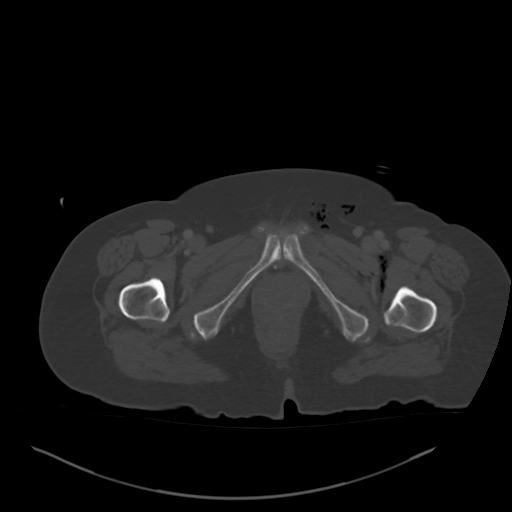
[im 16/101  soft-tissue]
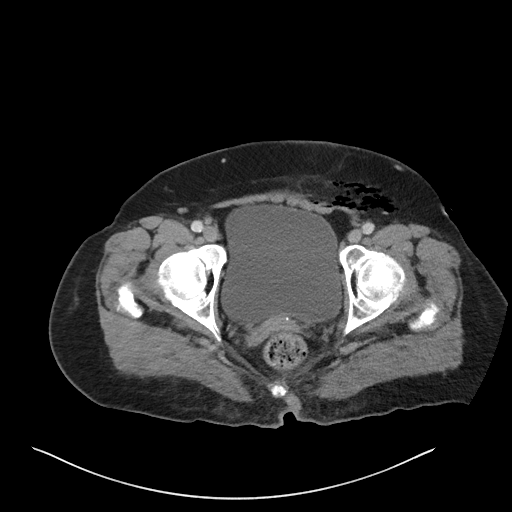
[im 27/101  soft-tissue]
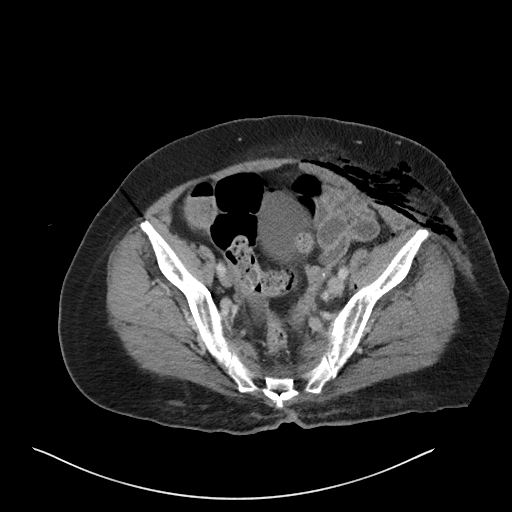
[im 32/101  soft-tissue]
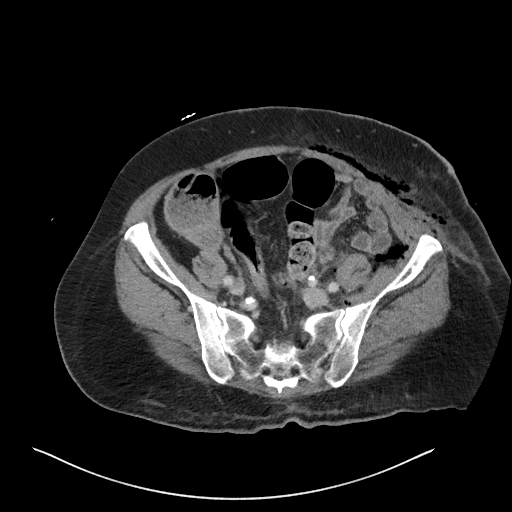
[im 43/101  soft-tissue]
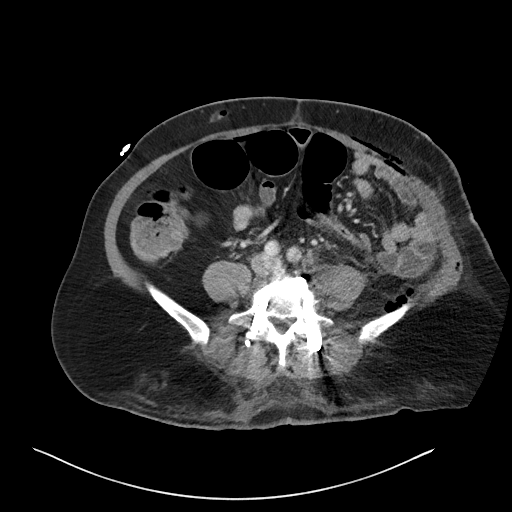
[im 53/101  soft-tissue]
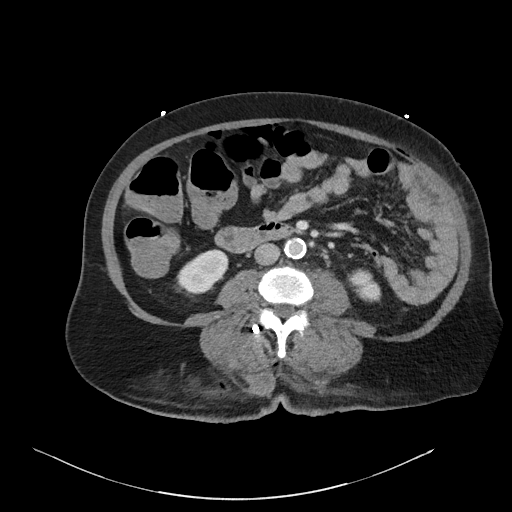
[im 58/101  soft-tissue]
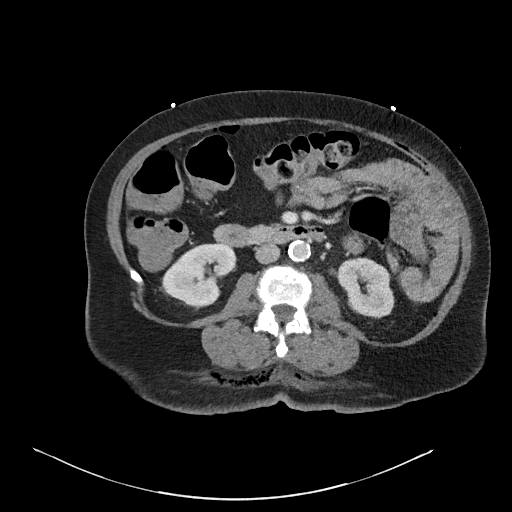
[im 69/101  soft-tissue]
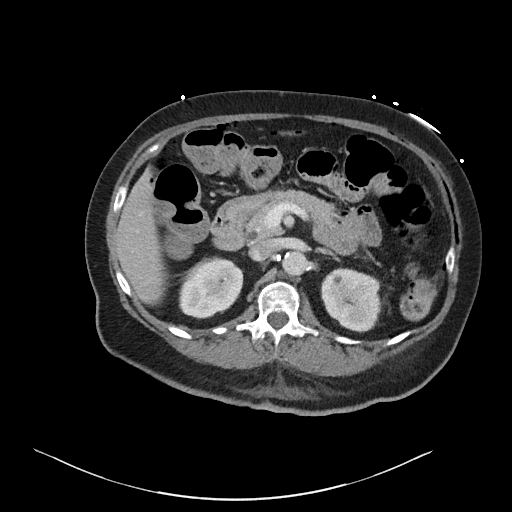
[im 74/101  soft-tissue]
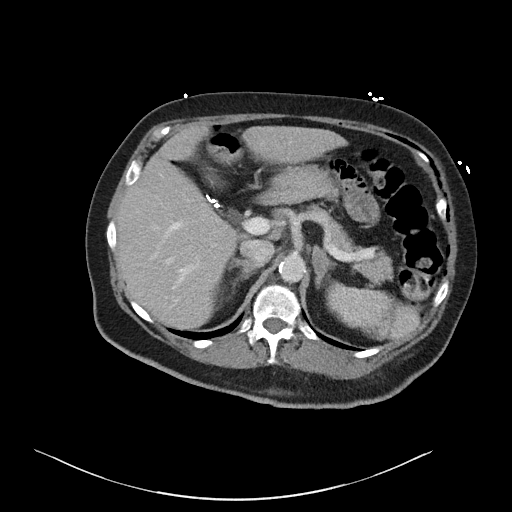
[im 74/101  bone]
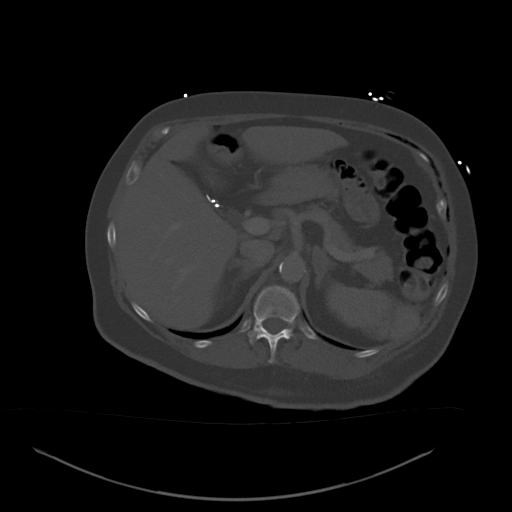
[im 85/101  soft-tissue]
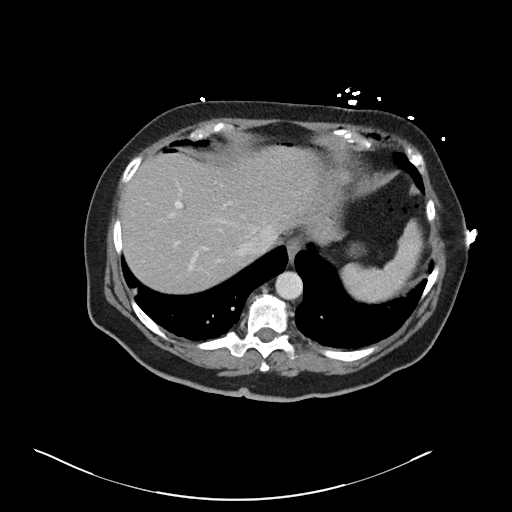
[im 95/101  soft-tissue]
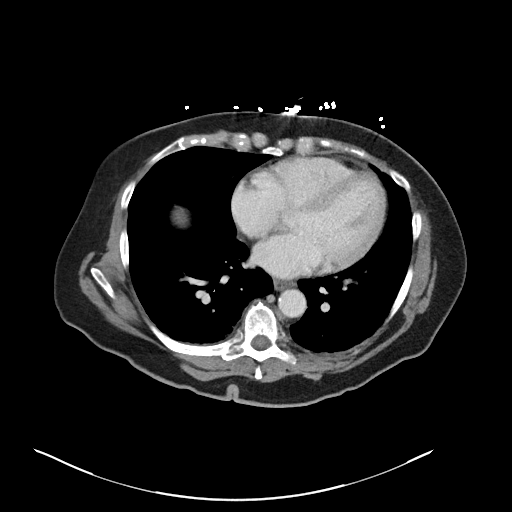

[Series 6: coronal soft tissue · coronal · 0.81mm/px · 3 of 100 slices shown]
[im 34/100  soft-tissue]
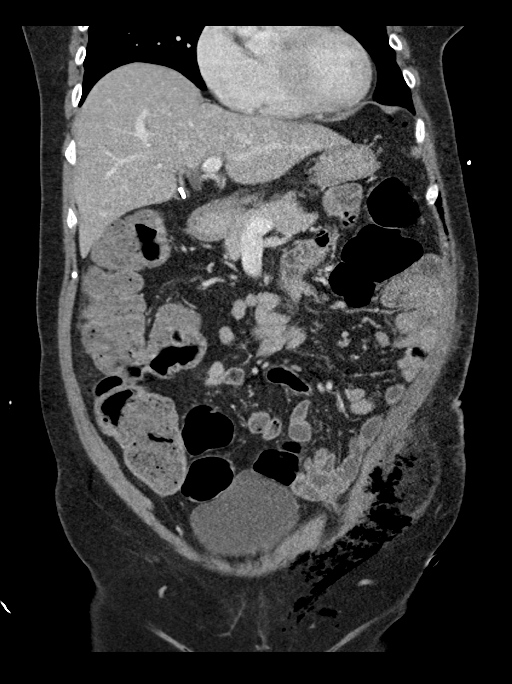
[im 45/100  soft-tissue]
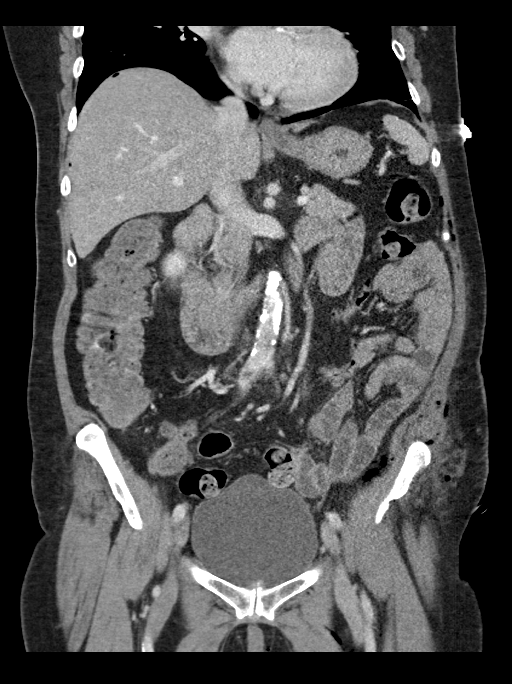
[im 56/100  soft-tissue]
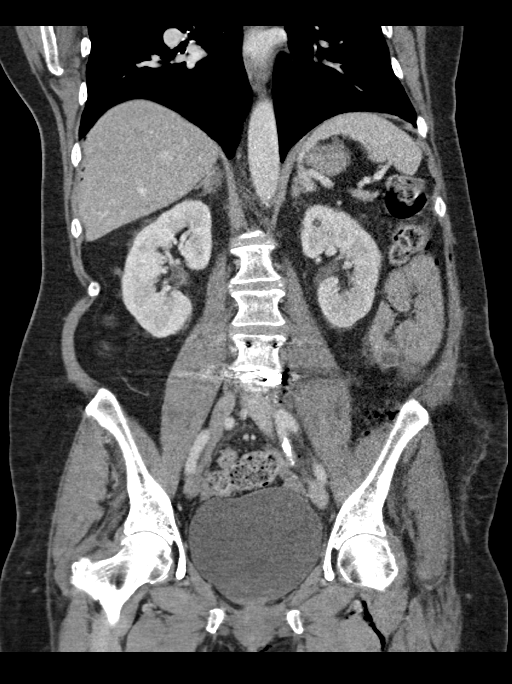

[14 of 46 positions shown; findings below may reference images not displayed]

FINDINGS: Lower chest: Right lower lobe subsegmental atelectasis.

Hepatobiliary: No suspicious hepatic lesion. Gallbladder surgically
absent. No biliary ductal dilation. There is some pneumoperitoneum
tracking along the proximal portal triads without definite portal
venous gas.

Pancreas: Within normal limits.

Spleen: Within normal limits.

Adrenals/Urinary Tract: Bilateral adrenal gland nodules measuring
2.1 cm on the left and 1.8 cm on the right. No hydronephrosis. Tiny
hypodense bilateral renal lesions which are technically too small to
accurately characterize but favored represent cysts. Gas in the
urinary bladder which may be related to recent instrumentation.

Stomach/Bowel: Stomach is unremarkable for degree of distension. No
pathologic dilation of small bowel. Small volume of formed stool in
the colon. No evidence of acute small bowel or colonic inflammation.

Vascular/Lymphatic: Aortic and branch vessel atherosclerosis without
aneurysmal dilation. No pathologically enlarged abdominal or pelvic
lymph nodes.

Reproductive: Status post hysterectomy. No adnexal masses.

Other: There is left anterior abdominal wall subcutaneous gas and
free fluid which tracks along the inguinal canal into the left mons
and left thigh musculature as well as along the left anterior
abdominal wall into the subcutaneous soft tissues of the lower
thorax, likely related to left lateral oblique anterior exposure
portion of the recent lumbar surgery for placement of interbody
spacer. There is pneumoperitoneum and pneumo retroperitoneum with
small volume of pelvic free fluid and left-sided retroperitoneal
free fluid. No.

Musculoskeletal: Postsurgical change of L4-L5 posterior spinal
fusion and discectomy with edema and subcutaneous gas in the
posterior subcutaneous soft tissues. 2.3 x 1.6 x 2.3 cm fluid
collection within the area of postsurgical change in the posterior
subcutaneous soft tissues on image 49/3 and 59/7.
IMPRESSION: 1. No evidence of acute bowel inflammation.
2. Postsurgical change of L4-L5 posterior spinal fusion and
discectomy with edema and subcutaneous gas in the posterior
subcutaneous soft tissues and a 2.3 x 1.6 x 2.3 cm fluid collection
within the area of postsurgical change in the posterior subcutaneous
soft tissues, favored to represent a postoperative seroma, however
abscess is not excluded.
3. Left anterior abdominal wall edema and subcutaneous gas with
pneumoperitoneum and pneumo retroperitoneum with small volume of
pelvic free fluid and left-sided retroperitoneal and peritoneal free
fluid. Likely reflecting postoperative change from the left lateral
oblique anterior exposure portion of the recent lumbar surgery.
4. Bilateral adrenal gland nodules measuring up to 2.1 cm on the
left and 1.8 cm on the right which are statistically likely to
represent adenomas however further evaluation with nonemergent
adrenal protocol CT or MRI is recommended.
5. Gas in the urinary bladder which may be related to recent
instrumentation.
6.  Aortic Atherosclerosis ([id number]-CUS.S).
